# Patient Record
Sex: Female | Born: 1985 | Race: Black or African American | Hispanic: No | Marital: Single | Smoking: Never smoker
Health system: Southern US, Community
[De-identification: ages and names within clinical notes are randomized; demographics above are authoritative.]

## PROBLEM LIST (undated history)

## (undated) DIAGNOSIS — D571 Sickle-cell disease without crisis: Secondary | ICD-10-CM

---

## 2016-03-08 ENCOUNTER — Emergency Department (HOSPITAL_COMMUNITY)
Admission: EM | Admit: 2016-03-08 | Discharge: 2016-03-08 | Disposition: A | Discharge status: Home / Self Care | Payer: Medicaid Other | Attending: Emergency Medicine | Admitting: Emergency Medicine

## 2016-03-08 ENCOUNTER — Encounter (HOSPITAL_COMMUNITY): Payer: Self-pay | Admitting: Emergency Medicine

## 2016-03-08 ENCOUNTER — Emergency Department (HOSPITAL_COMMUNITY): Payer: Medicaid Other

## 2016-03-08 DIAGNOSIS — F17200 Nicotine dependence, unspecified, uncomplicated: Secondary | ICD-10-CM | POA: Diagnosis not present

## 2016-03-08 DIAGNOSIS — Z79899 Other long term (current) drug therapy: Secondary | ICD-10-CM | POA: Diagnosis not present

## 2016-03-08 DIAGNOSIS — R0602 Shortness of breath: Secondary | ICD-10-CM | POA: Diagnosis not present

## 2016-03-08 DIAGNOSIS — D5700 Hb-SS disease with crisis, unspecified: Secondary | ICD-10-CM | POA: Diagnosis present

## 2016-03-08 DIAGNOSIS — R05 Cough: Secondary | ICD-10-CM | POA: Diagnosis not present

## 2016-03-08 LAB — BASIC METABOLIC PANEL
ANION GAP: 9 (ref 5–15)
BUN: 5 mg/dL — ABNORMAL LOW (ref 6–20)
CO2: 23 mmol/L (ref 22–32)
Calcium: 9.5 mg/dL (ref 8.9–10.3)
Chloride: 105 mmol/L (ref 101–111)
Creatinine, Ser: 0.4 mg/dL — ABNORMAL LOW (ref 0.44–1.00)
Glucose, Bld: 100 mg/dL — ABNORMAL HIGH (ref 65–99)
POTASSIUM: 4.1 mmol/L (ref 3.5–5.1)
SODIUM: 137 mmol/L (ref 135–145)

## 2016-03-08 LAB — CBC WITH DIFFERENTIAL/PLATELET
BASOS ABS: 0 10*3/uL (ref 0.0–0.1)
Basophils Relative: 0 %
Eosinophils Absolute: 0 10*3/uL (ref 0.0–0.7)
Eosinophils Relative: 0 %
HEMATOCRIT: 21 % — AB (ref 36.0–46.0)
HEMOGLOBIN: 7.5 g/dL — AB (ref 12.0–15.0)
LYMPHS PCT: 24 %
Lymphs Abs: 4.7 10*3/uL — ABNORMAL HIGH (ref 0.7–4.0)
MCH: 31.4 pg (ref 26.0–34.0)
MCHC: 35.7 g/dL (ref 30.0–36.0)
MCV: 87.9 fL (ref 78.0–100.0)
MONOS PCT: 9 %
Monocytes Absolute: 1.7 10*3/uL — ABNORMAL HIGH (ref 0.1–1.0)
NEUTROS PCT: 67 %
Neutro Abs: 13 10*3/uL — ABNORMAL HIGH (ref 1.7–7.7)
Platelets: 440 10*3/uL — ABNORMAL HIGH (ref 150–400)
RBC: 2.39 MIL/uL — AB (ref 3.87–5.11)
RDW: 26.6 % — ABNORMAL HIGH (ref 11.5–15.5)
WBC: 19.4 10*3/uL — AB (ref 4.0–10.5)

## 2016-03-08 MED ORDER — DIPHENHYDRAMINE HCL 25 MG PO CAPS
50.0000 mg | ORAL_CAPSULE | Freq: Once | ORAL | Status: AC
  Administered 2016-03-08: 50 mg via ORAL
  Filled 2016-03-08: qty 2

## 2016-03-08 MED ORDER — HYDROMORPHONE HCL 1 MG/ML IJ SOLN
1.0000 mg | Freq: Once | INTRAMUSCULAR | Status: AC
  Administered 2016-03-08: 1 mg via INTRAVENOUS
  Filled 2016-03-08: qty 1

## 2016-03-08 MED ORDER — ONDANSETRON HCL 4 MG/2ML IJ SOLN
4.0000 mg | Freq: Once | INTRAMUSCULAR | Status: DC
  Filled 2016-03-08: qty 2

## 2016-03-08 MED ORDER — HYDROMORPHONE HCL 2 MG/ML IJ SOLN
2.0000 mg | Freq: Once | INTRAMUSCULAR | Status: AC
  Administered 2016-03-08: 2 mg via INTRAVENOUS
  Filled 2016-03-08: qty 1

## 2016-03-08 MED ORDER — ONDANSETRON HCL 4 MG/2ML IJ SOLN
4.0000 mg | Freq: Once | INTRAMUSCULAR | Status: AC
  Administered 2016-03-08: 4 mg via INTRAVENOUS
  Filled 2016-03-08: qty 2

## 2016-03-08 NOTE — ED Notes (Signed)
Patient made aware of the need for urine.  Patient states is unable to go at this time.

## 2016-03-08 NOTE — Discharge Instructions (Signed)
Sickle Cell Anemia, Adult Sickle cell anemia is a condition in which red blood cells have an abnormal "sickle" shape. This abnormal shape shortens the cells' life span, which results in a lower than normal concentration of red blood cells in the blood. The sickle shape also causes the cells to clump together and block free blood flow through the blood vessels. As a result, the tissues and organs of the body do not receive enough oxygen. Sickle cell anemia causes organ damage and pain and increases the risk of infection. CAUSES  Sickle cell anemia is a genetic disorder. Those who receive two copies of the gene have the condition, and those who receive one copy have the trait. RISK FACTORS The sickle cell gene is most common in people whose families originated in [place]. Other areas of the globe where sickle cell trait occurs include the Mediterranean, [place] and [place], the [place], and the [place].  SIGNS AND SYMPTOMS  Pain, especially in the extremities, back, chest, or abdomen (common). The pain may start suddenly or may develop following an illness, especially if there is dehydration. Pain can also occur due to overexertion or exposure to extreme temperature changes.  Frequent severe bacterial infections, especially certain types of pneumonia and meningitis.  Pain and swelling in the hands and feet.  Decreased activity.   Loss of appetite.   Change in behavior.  Headaches.  Seizures.  Shortness of breath or difficulty breathing.  Vision changes.  Skin ulcers. Those with the trait may not have symptoms or they may have mild symptoms.  DIAGNOSIS  Sickle cell anemia is diagnosed with blood tests that demonstrate the genetic trait. It is often diagnosed during the newborn period, due to mandatory testing nationwide. A variety of blood tests, X-rays, CT scans, MRI scans, ultrasounds, and lung function tests may also be done to monitor the condition. TREATMENT  Sickle  cell anemia may be treated with:  Medicines. You may be given pain medicines, antibiotic medicines (to treat and prevent infections) or medicines to increase the production of certain types of hemoglobin.  Fluids.  Oxygen.  Blood transfusions. HOME CARE INSTRUCTIONS   Drink enough fluid to keep your urine clear or pale yellow. Increase your fluid intake in hot weather and during exercise.  Do not smoke. Smoking lowers oxygen levels in the blood.   Only take over-the-counter or prescription medicines for pain, fever, or discomfort as directed by your health care provider.  Take antibiotics as directed by your health care provider. Make sure you finish them it even if you start to feel better.   Take supplements as directed by your health care provider.   Consider wearing a medical alert bracelet. This tells anyone caring for you in an emergency of your condition.   When traveling, keep your medical information, health care provider's names, and the medicines you take with you at all times.   If you develop a fever, do not take medicines to reduce the fever right away. This could cover up a problem that is developing. Notify your health care provider.  Keep all follow-up appointments with your health care provider. Sickle cell anemia requires regular medical care. SEEK MEDICAL CARE IF: You have a fever. SEEK IMMEDIATE MEDICAL CARE IF:   You feel dizzy or faint.   You have new abdominal pain, especially on the left side near the stomach area.   You develop a persistent, often uncomfortable and painful penile erection (priapism). If this is not treated immediately it   will lead to impotence.   You have numbness your arms or legs or you have a hard time moving them.   You have a hard time with speech.   You have a fever or persistent symptoms for more than 2-3 days.   You have a fever and your symptoms suddenly get worse.   You have signs or symptoms of infection.  These include:   Chills.   Abnormal tiredness (lethargy).   Irritability.   Poor eating.   Vomiting.   You develop pain that is not helped with medicine.   You develop shortness of breath.  You have pain in your chest.   You are coughing up pus-like or bloody sputum.   You develop a stiff neck.  Your feet or hands swell or have pain.  Your abdomen appears bloated.  You develop joint pain. MAKE SURE YOU:  Understand these instructions.   This information is not intended to replace advice given to you by your health care provider. Make sure you discuss any questions you have with your health care provider.   Document Released: 02/04/2006 Document Revised: 11/17/2014 Document Reviewed: 06/08/2013 Elsevier Interactive Patient Education 2016 [employer] Inc[employer]  

## 2016-03-08 NOTE — ED Provider Notes (Signed)
Received care of patient from Dr. Christy Gentles at 7 AM. Please see his note for prior history, physical and care. Briefly this is a 30 year old female with a history of sickle cell who presents with bilateral lower extremity pain consistent with prior sickle cell pain crises. Patient reports her baseline hemoglobin is 7, he has been here 7.5. Reports she has persistent leukocytosis. Denies any infectious symptoms. BMP within normal limits. Patient received 3 doses of IV Dilaudid with improvement of her pain. Pt ambulatory, tolerating po. Patient discharged in stable condition with understanding of reasons to return.        Basic metabolic panel (Final result)Abnormal Component (Lab Inquiry)    Collection Time Result Time NA K CL CO2 GLUCOSE   03/08/16 06:49:00 03/08/16 07:31:04 137 4.1 105 23 100 (H)      Collection Time Result Time BUN Creatinine, Ser CALCIUM GFR calc non Af Amer GFR calc Af Amer   03/08/16 06:49:00 03/08/16 07:31:04 5 (L) 0.40 (L) 9.5 >60  >60    (Comment)    (NOTE)  The eGFR has been calculated using the CKD EPI equation.  This calculation has not been validated in all clinical situations.  eGFR's persistently <60 mL/min signify possible Chronic Kidney  Disease.            Collection Time Result Time Anion gap   03/08/16 06:49:00 03/08/16 07:31:04 9             CBC with Differential/Platelet (Final result)Abnormal Component (Lab Inquiry)    Collection Time Result Time WBC RBC HGB HCT MCV   03/08/16 06:49:00 03/08/16 07:32:03  19.4 (H)    (Comment)    REPEATED TO VERIFY  WHITE COUNT CONFIRMED ON SMEAR  RARE NRBCs       2.39 (L) 7.5 (L) 21.0 (L) 87.9      Collection Time Result Time MCH MCHC RDW PLT NEUTRO PCT   03/08/16 06:49:00 03/08/16 07:32:03 31.4 35.7 26.6 (H) 440 (H) 67      Collection Time Result Time LYMPHO PCT MONO PCT EOS PCT BASOS PCT AB NEUTRO   03/08/16 06:49:00 03/08/16 07:32:03 24 9 0 0 13.0 (H)       Collection Time Result Time AB LYM MONO ABS EOSINO ABS BASOS ABS RBC Morphology   03/08/16 06:49:00 03/08/16 07:32:03 4.7 (H) 1.7 (H) 0.0 0.0 POLYCHROMASIA PRESENT TARGET CELLS TEARDROP CELLS SICKLE CELLS            Imaging Results       DG Chest 2 View (Final result) Result time: 03/08/16 06:27:51   Final result by Rad Results In Interface (03/08/16 06:27:51)   Narrative:   CLINICAL DATA: Sickle cell crisis  EXAM: CHEST 2 VIEW  COMPARISON: None.  FINDINGS: Normal heart size and mediastinal contours. No acute infiltrate or edema. No effusion or pneumothorax. Cholecystectomy clips. No acute osseous finding.  IMPRESSION: Negative chest.      Gareth Morgan, MD 03/09/16 [phone]

## 2016-03-08 NOTE — ED Provider Notes (Signed)
CSN: [id number]     Arrival date & time 03/08/16  0309 History   First MD Initiated Contact with Patient 03/08/16 [phone]     Chief Complaint  Patient presents with  . Sickle Cell Pain Crisis      Patient is a 30 y.o. female presenting with sickle cell pain. The history is provided by the patient.  Sickle Cell Pain Crisis Location:  Lower extremity Severity:  Moderate Onset quality:  Gradual Duration:  1 day Timing:  Constant Progression:  Worsening Chronicity:  Recurrent Relieved by:  Nothing Worsened by:  Nothing tried Associated symptoms: cough and shortness of breath   Associated symptoms: no chest pain, no fever and no vomiting   Patient with h/o sickle cell disease presents with pain in bilateral lower extremities for past day It is worsening This is similar to prior episodes of pain crisis She also reports mild lower abdominal pain She also reportscough and SOB No CP No fever She lives in [place] and here visiting in [place]    PMH - sickle cell disease  Social History  Substance Use Topics  . Smoking status: Current Every Day Smoker  . Smokeless tobacco: None  . Alcohol Use: No   OB History    No data available     Review of Systems  Constitutional: Negative for fever.  Respiratory: Positive for cough and shortness of breath.   Cardiovascular: Negative for chest pain.  Gastrointestinal: Negative for vomiting.  All other systems reviewed and are negative.     Allergies  Review of patient's allergies indicates no known allergies.  Home Medications   Prior to Admission medications   Medication Sig Start Date End Date Taking? Authorizing Provider  folic acid (FOLVITE) 1 MG tablet Take 1 mg by mouth daily.   Yes Historical Provider, MD  hydroxyurea (HYDREA) 500 MG capsule Take 1,500 mg by mouth daily. May take with food to minimize GI side effects.   Yes Historical Provider, MD  Oxycodone HCl 10 MG TABS Take 10 mg by mouth every 4 (four) hours as  needed. For pain. 02/05/16  Yes Historical Provider, MD  OXYCONTIN 10 MG 12 hr tablet Take 10 mg by mouth 2 (two) times daily. 02/11/16  Yes Historical Provider, MD   BP 103/53 mmHg  Pulse 86  Temp(Src) 98.6 F (37 C) (Oral)  Resp 18  SpO2 92%  LMP 02/09/2016 Physical Exam CONSTITUTIONAL: Well developed/well nourished HEAD: Normocephalic/atraumatic EYES: EOMI/PERRL ENMT: Mucous membranes moist NECK: supple no meningeal signs SPINE/BACK:entire spine nontender CV: S1/S2 noted, no murmurs/rubs/gallops noted LUNGS: Lungs are clear to auscultation bilaterally, no apparent distress ABDOMEN: soft, mild LLQ tenderness, no rebound or guarding NEURO: Pt is awake/alert/appropriate, moves all extremitiesx4.  No facial droop.   EXTREMITIES: pulses normal/equal, full ROM, no edema to lower extremities, no deformities, no joint effusions noted SKIN: warm, color normal PSYCH: no abnormalities of mood noted, alert and oriented to situation  ED Course  Procedures  Medications  HYDROmorphone (DILAUDID) injection 1 mg (not administered)  ondansetron (ZOFRAN) injection 4 mg (not administered)  diphenhydrAMINE (BENADRYL) capsule 50 mg (not administered)  HYDROmorphone (DILAUDID) injection 1 mg (1 mg Intravenous Given 03/08/16 0633)  ondansetron (ZOFRAN) injection 4 mg (4 mg Intravenous Given 03/08/16 0630)    Labs Review Labs Reviewed  BASIC METABOLIC PANEL  CBC WITH DIFFERENTIAL/PLATELET  URINALYSIS, ROUTINE W REFLEX MICROSCOPIC (NOT AT [facility] [place])  POC URINE PREG, ED    Imaging Review Dg Chest 2 View  03/08/2016  CLINICAL DATA:  Sickle cell crisis EXAM: CHEST  2 VIEW COMPARISON:  None. FINDINGS: Normal heart size and mediastinal contours. No acute infiltrate or edema. No effusion or pneumothorax. Cholecystectomy clips. No acute osseous finding. IMPRESSION: Negative chest. Electronically Signed   By: Monte Fantasia M.D.   On: 03/08/2016 06:27   I have personally reviewed and evaluated these images  and lab results as part of my medical decision-making.   7:23 AM Pt stable Labs pending She reports being out of pain meds as she lives in [place] of [place] Will treat pain and reassess D/w dr Billy Fischer to reassess after pain meds BP 103/53 mmHg  Pulse 86  Temp(Src) 98.6 F (37 C) (Oral)  Resp 18  SpO2 92%  LMP 02/09/2016  MDM   Final diagnoses:  Sickle cell pain crisis [facility])    Nursing notes including past medical history and social history reviewed and considered in documentation xrays/imaging reviewed by myself and considered during evaluation     Ripley Fraise, MD 03/08/16 [phone]

## 2016-03-08 NOTE — ED Notes (Signed)
Pt. Made aware for the need of urine. 

## 2016-03-08 NOTE — ED Notes (Signed)
This RN attempted IVx2, asked Liliane Channel, RN to try.

## 2016-03-08 NOTE — ED Notes (Signed)
Pt c/o of pain from the waist down all day today. Pt out of pain meds x 3 days. Pt placed on 2L 02, no longer feels [facility]

## 2016-03-15 ENCOUNTER — Encounter (HOSPITAL_COMMUNITY): Payer: Self-pay | Admitting: *Deleted

## 2016-03-15 ENCOUNTER — Emergency Department (HOSPITAL_COMMUNITY): Payer: Medicaid Other

## 2016-03-15 ENCOUNTER — Inpatient Hospital Stay (HOSPITAL_COMMUNITY)
Admission: EM | Admit: 2016-03-15 | Discharge: 2016-03-19 | DRG: 812 | Disposition: A | Discharge status: Home / Self Care | Payer: Medicaid Other | Attending: Internal Medicine | Admitting: Internal Medicine

## 2016-03-15 DIAGNOSIS — F17200 Nicotine dependence, unspecified, uncomplicated: Secondary | ICD-10-CM | POA: Diagnosis present

## 2016-03-15 DIAGNOSIS — R0789 Other chest pain: Secondary | ICD-10-CM | POA: Diagnosis not present

## 2016-03-15 DIAGNOSIS — D5700 Hb-SS disease with crisis, unspecified: Secondary | ICD-10-CM | POA: Diagnosis present

## 2016-03-15 DIAGNOSIS — R0902 Hypoxemia: Secondary | ICD-10-CM | POA: Diagnosis not present

## 2016-03-15 DIAGNOSIS — J9811 Atelectasis: Secondary | ICD-10-CM | POA: Diagnosis not present

## 2016-03-15 DIAGNOSIS — Z9049 Acquired absence of other specified parts of digestive tract: Secondary | ICD-10-CM | POA: Diagnosis not present

## 2016-03-15 DIAGNOSIS — Z8673 Personal history of transient ischemic attack (TIA), and cerebral infarction without residual deficits: Secondary | ICD-10-CM | POA: Diagnosis not present

## 2016-03-15 DIAGNOSIS — R079 Chest pain, unspecified: Secondary | ICD-10-CM | POA: Diagnosis not present

## 2016-03-15 DIAGNOSIS — D638 Anemia in other chronic diseases classified elsewhere: Secondary | ICD-10-CM | POA: Insufficient documentation

## 2016-03-15 DIAGNOSIS — D7589 Other specified diseases of blood and blood-forming organs: Secondary | ICD-10-CM | POA: Diagnosis present

## 2016-03-15 DIAGNOSIS — D72829 Elevated white blood cell count, unspecified: Secondary | ICD-10-CM | POA: Diagnosis present

## 2016-03-15 DIAGNOSIS — R17 Unspecified jaundice: Secondary | ICD-10-CM | POA: Diagnosis present

## 2016-03-15 DIAGNOSIS — Z79899 Other long term (current) drug therapy: Secondary | ICD-10-CM

## 2016-03-15 DIAGNOSIS — D473 Essential (hemorrhagic) thrombocythemia: Secondary | ICD-10-CM | POA: Diagnosis present

## 2016-03-15 DIAGNOSIS — D75839 Thrombocytosis, unspecified: Secondary | ICD-10-CM | POA: Diagnosis present

## 2016-03-15 DIAGNOSIS — D599 Acquired hemolytic anemia, unspecified: Secondary | ICD-10-CM | POA: Diagnosis not present

## 2016-03-15 DIAGNOSIS — J9601 Acute respiratory failure with hypoxia: Secondary | ICD-10-CM | POA: Diagnosis not present

## 2016-03-15 HISTORY — DX: Sickle-cell disease without crisis: D57.1

## 2016-03-15 LAB — BASIC METABOLIC PANEL
ANION GAP: 10 (ref 5–15)
BUN: 7 mg/dL (ref 6–20)
CHLORIDE: 104 mmol/L (ref 101–111)
CO2: 26 mmol/L (ref 22–32)
Calcium: 9.9 mg/dL (ref 8.9–10.3)
Creatinine, Ser: 0.33 mg/dL — ABNORMAL LOW (ref 0.44–1.00)
GFR calc non Af Amer: >60 mL/min (ref >60)
Glucose, Bld: 122 mg/dL — ABNORMAL HIGH (ref 65–99)
POTASSIUM: 3.6 mmol/L (ref 3.5–5.1)
Sodium: 140 mmol/L (ref 135–145)

## 2016-03-15 LAB — COMPREHENSIVE METABOLIC PANEL
ALBUMIN: 4.6 g/dL (ref 3.5–5.0)
ALT: 18 U/L (ref 14–54)
ANION GAP: 10 (ref 5–15)
AST: 42 U/L — ABNORMAL HIGH (ref 15–41)
Alkaline Phosphatase: 49 U/L (ref 38–126)
BILIRUBIN TOTAL: 5.4 mg/dL — AB (ref 0.3–1.2)
BUN: 7 mg/dL (ref 6–20)
CO2: 24 mmol/L (ref 22–32)
Calcium: 9.3 mg/dL (ref 8.9–10.3)
Chloride: 104 mmol/L (ref 101–111)
Creatinine, Ser: 0.38 mg/dL — ABNORMAL LOW (ref 0.44–1.00)
GFR calc Af Amer: >60 mL/min (ref >60)
Glucose, Bld: 116 mg/dL — ABNORMAL HIGH (ref 65–99)
POTASSIUM: 4.3 mmol/L (ref 3.5–5.1)
Sodium: 138 mmol/L (ref 135–145)
TOTAL PROTEIN: 7.7 g/dL (ref 6.5–8.1)

## 2016-03-15 LAB — CBC WITH DIFFERENTIAL/PLATELET
Basophils Absolute: 0 10*3/uL (ref 0.0–0.1)
Basophils Relative: 0 %
Eosinophils Absolute: 0 10*3/uL (ref 0.0–0.7)
Eosinophils Relative: 0 %
HCT: 18.8 % — ABNORMAL LOW (ref 36.0–46.0)
Hemoglobin: 6.6 g/dL — CL (ref 12.0–15.0)
Lymphocytes Relative: 11 %
Lymphs Abs: 3.4 10*3/uL (ref 0.7–4.0)
MCH: 30.8 pg (ref 26.0–34.0)
MCHC: 35.1 g/dL (ref 30.0–36.0)
MCV: 87.9 fL (ref 78.0–100.0)
Monocytes Absolute: 2.2 10*3/uL — ABNORMAL HIGH (ref 0.1–1.0)
Monocytes Relative: 7 %
Neutro Abs: 25.3 10*3/uL — ABNORMAL HIGH (ref 1.7–7.7)
Neutrophils Relative %: 82 %
Platelets: 424 10*3/uL — ABNORMAL HIGH (ref 150–400)
RBC: 2.14 MIL/uL — ABNORMAL LOW (ref 3.87–5.11)
RDW: 24.2 % — ABNORMAL HIGH (ref 11.5–15.5)
WBC: 30.9 10*3/uL — ABNORMAL HIGH (ref 4.0–10.5)

## 2016-03-15 LAB — URINALYSIS, ROUTINE W REFLEX MICROSCOPIC
Glucose, UA: NEGATIVE mg/dL
Hgb urine dipstick: NEGATIVE
KETONES UR: NEGATIVE mg/dL
LEUKOCYTES UA: NEGATIVE
Nitrite: POSITIVE — AB
PH: 6 (ref 5.0–8.0)
PROTEIN: NEGATIVE mg/dL
Specific Gravity, Urine: 1.014 (ref 1.005–1.030)

## 2016-03-15 LAB — RETICULOCYTES
RBC.: 2.35 MIL/uL — ABNORMAL LOW (ref 3.87–5.11)
RBC.: 2.17 MIL/uL — ABNORMAL LOW (ref 3.87–5.11)
Retic Ct Pct: >23 % — ABNORMAL HIGH (ref 0.4–3.1)

## 2016-03-15 LAB — LACTATE DEHYDROGENASE: LDH: 405 U/L — AB (ref 98–192)

## 2016-03-15 LAB — I-STAT BETA HCG BLOOD, ED (MC, WL, AP ONLY): I-stat hCG, quantitative: <5 m[IU]/mL (ref <5)

## 2016-03-15 LAB — CBC
HEMATOCRIT: 20.8 % — AB (ref 36.0–46.0)
HEMOGLOBIN: 7.2 g/dL — AB (ref 12.0–15.0)
MCH: 30.6 pg (ref 26.0–34.0)
MCHC: 34.6 g/dL (ref 30.0–36.0)
MCV: 88.5 fL (ref 78.0–100.0)
Platelets: 436 10*3/uL — ABNORMAL HIGH (ref 150–400)
RBC: 2.35 MIL/uL — ABNORMAL LOW (ref 3.87–5.11)
RDW: 25.7 % — ABNORMAL HIGH (ref 11.5–15.5)
WBC: 28.7 10*3/uL — AB (ref 4.0–10.5)

## 2016-03-15 LAB — URINE MICROSCOPIC-ADD ON
RBC / HPF: NONE SEEN RBC/hpf (ref 0–5)
WBC UA: NONE SEEN WBC/hpf (ref 0–5)

## 2016-03-15 LAB — MAGNESIUM: Magnesium: 1.8 mg/dL (ref 1.7–2.4)

## 2016-03-15 LAB — I-STAT TROPONIN, ED: Troponin i, poc: 0 ng/mL (ref 0.00–0.08)

## 2016-03-15 LAB — PROTIME-INR
INR: 1.27 (ref 0.00–1.49)
PROTHROMBIN TIME: 15.5 s — AB (ref 11.6–15.2)

## 2016-03-15 MED ORDER — DIPHENHYDRAMINE HCL 50 MG/ML IJ SOLN: 12.5000 mg | Freq: Four times a day (QID) | INTRAMUSCULAR | Status: DC | PRN

## 2016-03-15 MED ORDER — SODIUM CHLORIDE 0.9% FLUSH: 9.0000 mL | INTRAVENOUS | Status: DC | PRN

## 2016-03-15 MED ORDER — SODIUM CHLORIDE 0.9% FLUSH
9.0000 mL | INTRAVENOUS | Status: DC | PRN
Start: 2016-03-15 — End: 2016-03-19

## 2016-03-15 MED ORDER — HYDROMORPHONE HCL 2 MG/ML IJ SOLN
2.0000 mg | INTRAMUSCULAR | Status: DC
  Administered 2016-03-15 – 2016-03-17 (×21): 2 mg via INTRAVENOUS
  Filled 2016-03-15 (×14): qty 1

## 2016-03-15 MED ORDER — ONDANSETRON HCL 4 MG/2ML IJ SOLN
4.0000 mg | Freq: Four times a day (QID) | INTRAMUSCULAR | Status: DC | PRN
Start: 2016-03-15 — End: 2016-03-15

## 2016-03-15 MED ORDER — IOPAMIDOL (ISOVUE-370) INJECTION 76%
100.0000 mL | Freq: Once | INTRAVENOUS | Status: DC | PRN
Start: 2016-03-15 — End: 2016-03-19

## 2016-03-15 MED ORDER — HYDROMORPHONE 1 MG/ML IV SOLN
INTRAVENOUS | Status: DC
  Administered 2016-03-15: 07:00:00 via INTRAVENOUS
  Filled 2016-03-15: qty 25

## 2016-03-15 MED ORDER — ONDANSETRON HCL 4 MG/2ML IJ SOLN
4.0000 mg | INTRAMUSCULAR | Status: DC | PRN
  Administered 2016-03-15: 4 mg via INTRAVENOUS
  Filled 2016-03-15: qty 2

## 2016-03-15 MED ORDER — SODIUM CHLORIDE 0.9 % IV SOLN
25.0000 mg | INTRAVENOUS | Status: DC | PRN
  Filled 2016-03-15: qty 0.5

## 2016-03-15 MED ORDER — HYDROMORPHONE HCL 2 MG/ML IJ SOLN: 0.0375 mg/kg | INTRAMUSCULAR | Status: AC

## 2016-03-15 MED ORDER — HYDROMORPHONE HCL 2 MG/ML IJ SOLN
0.0250 mg/kg | INTRAMUSCULAR | Status: AC
  Administered 2016-03-15: 1.4 mg via INTRAVENOUS
  Filled 2016-03-15: qty 1

## 2016-03-15 MED ORDER — HYDROMORPHONE HCL 2 MG/ML IJ SOLN
0.0313 mg/kg | INTRAMUSCULAR | Status: AC
  Administered 2016-03-15: 1.7 mg via INTRAVENOUS
  Filled 2016-03-15: qty 1

## 2016-03-15 MED ORDER — DIPHENHYDRAMINE HCL 25 MG PO CAPS
25.0000 mg | ORAL_CAPSULE | ORAL | Status: DC | PRN
  Administered 2016-03-16 – 2016-03-18 (×3): 25 mg via ORAL
  Filled 2016-03-15 (×3): qty 1

## 2016-03-15 MED ORDER — POLYETHYLENE GLYCOL 3350 17 G PO PACK: 17.0000 g | PACK | Freq: Every day | ORAL | Status: DC | PRN

## 2016-03-15 MED ORDER — DEXTROSE-NACL 5-0.45 % IV SOLN
INTRAVENOUS | Status: AC
  Administered 2016-03-15: 07:00:00 via INTRAVENOUS

## 2016-03-15 MED ORDER — ENSURE ENLIVE PO LIQD
237.0000 mL | Freq: Two times a day (BID) | ORAL | Status: DC
  Administered 2016-03-15 – 2016-03-19 (×6): 237 mL via ORAL

## 2016-03-15 MED ORDER — SODIUM CHLORIDE 0.45 % IV SOLN
INTRAVENOUS | Status: DC
  Administered 2016-03-15 – 2016-03-18 (×11): via INTRAVENOUS
  Administered 2016-03-19: 1000 mL via INTRAVENOUS

## 2016-03-15 MED ORDER — NALOXONE HCL 0.4 MG/ML IJ SOLN: 0.4000 mg | INTRAMUSCULAR | Status: DC | PRN

## 2016-03-15 MED ORDER — HYDROMORPHONE HCL 2 MG/ML IJ SOLN
2.0000 mg | INTRAMUSCULAR | Status: DC | PRN
Start: 2016-03-15 — End: 2016-03-15

## 2016-03-15 MED ORDER — ENOXAPARIN SODIUM 40 MG/0.4ML ~~LOC~~ SOLN
40.0000 mg | SUBCUTANEOUS | Status: DC
  Filled 2016-03-15 (×3): qty 0.4

## 2016-03-15 MED ORDER — KETOROLAC TROMETHAMINE 15 MG/ML IJ SOLN
30.0000 mg | Freq: Four times a day (QID) | INTRAMUSCULAR | Status: DC
  Administered 2016-03-15 – 2016-03-19 (×16): 30 mg via INTRAVENOUS
  Filled 2016-03-15 (×15): qty 2

## 2016-03-15 MED ORDER — FOLIC ACID 1 MG PO TABS
1.0000 mg | ORAL_TABLET | Freq: Every day | ORAL | Status: DC
  Administered 2016-03-15 – 2016-03-19 (×5): 1 mg via ORAL
  Filled 2016-03-15 (×5): qty 1

## 2016-03-15 MED ORDER — KETOROLAC TROMETHAMINE 15 MG/ML IJ SOLN
15.0000 mg | Freq: Four times a day (QID) | INTRAMUSCULAR | Status: DC
  Administered 2016-03-15 (×2): 15 mg via INTRAVENOUS
  Filled 2016-03-15 (×2): qty 1

## 2016-03-15 MED ORDER — HYDROMORPHONE HCL 2 MG/ML IJ SOLN: 0.0313 mg/kg | INTRAMUSCULAR | Status: AC

## 2016-03-15 MED ORDER — HYDROMORPHONE HCL 2 MG/ML IJ SOLN
0.0375 mg/kg | INTRAMUSCULAR | Status: AC
  Administered 2016-03-15: 2.1 mg via INTRAVENOUS
  Filled 2016-03-15: qty 2

## 2016-03-15 MED ORDER — HYDROMORPHONE 1 MG/ML IV SOLN
INTRAVENOUS | Status: DC
  Administered 2016-03-15: 13.2 mg via INTRAVENOUS
  Administered 2016-03-15: 23:00:00 via INTRAVENOUS
  Administered 2016-03-15: 5 mg via INTRAVENOUS
  Administered 2016-03-16: 2.5 mg via INTRAVENOUS
  Administered 2016-03-16: 6.5 mg via INTRAVENOUS
  Administered 2016-03-16: 5.5 mg via INTRAVENOUS
  Administered 2016-03-16: 1.5 mg via INTRAVENOUS
  Administered 2016-03-16: 3.5 mg via INTRAVENOUS
  Administered 2016-03-16: 3.96 mg via INTRAVENOUS
  Administered 2016-03-17: 4.5 mg via INTRAVENOUS
  Administered 2016-03-17: 1.25 mg via INTRAVENOUS
  Administered 2016-03-17 (×2): 2.5 mg via INTRAVENOUS
  Administered 2016-03-17: 0 mg via INTRAVENOUS
  Administered 2016-03-18 (×2): 4 mg via INTRAVENOUS
  Administered 2016-03-18: 4.5 mg via INTRAVENOUS
  Administered 2016-03-18: 5 mg via INTRAVENOUS
  Administered 2016-03-18: 11:00:00 via INTRAVENOUS
  Administered 2016-03-18: 0.5 mg via INTRAVENOUS
  Administered 2016-03-19: 25 mg via INTRAVENOUS
  Administered 2016-03-19: 8 mg via INTRAVENOUS
  Administered 2016-03-19: 4.5 mg via INTRAVENOUS
  Administered 2016-03-19: 8 mg via INTRAVENOUS
  Administered 2016-03-19: 3 mg via INTRAVENOUS
  Filled 2016-03-15 (×4): qty 25

## 2016-03-15 MED ORDER — HYDROMORPHONE HCL 2 MG/ML IJ SOLN
2.0000 mg | INTRAMUSCULAR | Status: DC
  Administered 2016-03-15 (×2): 2 mg via INTRAVENOUS

## 2016-03-15 MED ORDER — HYDROMORPHONE HCL 2 MG/ML IJ SOLN
2.0000 mg | Freq: Once | INTRAMUSCULAR | Status: AC
  Administered 2016-03-15: 2 mg via INTRAVENOUS

## 2016-03-15 MED ORDER — DIPHENHYDRAMINE HCL 25 MG PO CAPS: 25.0000 mg | ORAL_CAPSULE | ORAL | Status: DC | PRN

## 2016-03-15 MED ORDER — HYDROMORPHONE HCL 2 MG/ML IJ SOLN: 0.0250 mg/kg | INTRAMUSCULAR | Status: AC

## 2016-03-15 MED ORDER — KETOROLAC TROMETHAMINE 30 MG/ML IJ SOLN
30.0000 mg | Freq: Once | INTRAMUSCULAR | Status: AC
  Administered 2016-03-15: 30 mg via INTRAVENOUS
  Filled 2016-03-15: qty 1

## 2016-03-15 MED ORDER — DIPHENHYDRAMINE HCL 12.5 MG/5ML PO ELIX: 12.5000 mg | ORAL_SOLUTION | Freq: Four times a day (QID) | ORAL | Status: DC | PRN

## 2016-03-15 MED ORDER — HYDROXYUREA 500 MG PO CAPS
1500.0000 mg | ORAL_CAPSULE | Freq: Every day | ORAL | Status: DC
  Administered 2016-03-16: 1500 mg via ORAL
  Filled 2016-03-15: qty 3

## 2016-03-15 MED ORDER — SENNOSIDES-DOCUSATE SODIUM 8.6-50 MG PO TABS
1.0000 | ORAL_TABLET | Freq: Two times a day (BID) | ORAL | Status: DC
  Administered 2016-03-15 – 2016-03-19 (×8): 1 via ORAL
  Filled 2016-03-15 (×9): qty 1

## 2016-03-15 NOTE — Progress Notes (Signed)
Patient c/o PIV site leaking. RN flushed site and put a new dressing on IV site. Patient was all right with the site for a while and now is c/o PIV again. IV team was paged.

## 2016-03-15 NOTE — Progress Notes (Signed)
Received call from CCMD that patient had removed heart monitor.  Patient up in bathroom with significant other.  Informed patient that per order telemetry monitor has to stay on while there is an order; replaced heart monitor and also educated patient that she is high fall risk d/t recent falls and must call for staff when she is up in the room.  Patient agreeable to this. Patient also requesting more pain medication.  Will continue to monitor patient.

## 2016-03-15 NOTE — ED Notes (Signed)
Entered patient room to find patient had disconnected IV fluids, removed BP cuff, and taken off SPO2 monitor. Patient was chagned into a dry gown and reconnected to IV fluids, placed back on BP and SPO2 monitoring, and telemetry monitor.

## 2016-03-15 NOTE — Progress Notes (Addendum)
Patient refusing Lovenox; states 'I know it is to prevent blood clots.  I don't take the lovenox." Dr. Zigmund Daniel aware; SCDs ordered and placed on patient.

## 2016-03-15 NOTE — ED Notes (Signed)
Patient requesting additional pain meds

## 2016-03-15 NOTE — ED Provider Notes (Signed)
CSN: [id number]     Arrival date & time 03/15/16  0010 History  By signing my name below, I, Emmanuella Mensah, attest that this documentation has been prepared under the direction and in the presence of Dorie Rank, MD. Electronically Signed: Judithann Sauger, ED Scribe. 03/15/2016. 1:59 AM.    Chief Complaint  Patient presents with  . Chest Pain   The history is provided by the patient. No language interpreter was used.   HPI Comments: Brittney Rocha is a 30 y.o. female with a hx of sickle cell anemia who presents to the Emergency Department complaining of gradually worsening chest pain and upper extremities pain onset 4 hours ago. She reports associated nausea and SOB. She explains that her sickle cell crisis pain is normally in her bilateral legs but she reports a hx of chest syndrome. No alleviating factors noted. Pt has not tried any medications PTA. She reports NKDA. No fever, chills, or vomiting.   Sickle Cell physician is in [place], [place].  Past Medical History  Diagnosis Date  . Sickle cell anemia (HCC)    History reviewed. No pertinent past surgical history. No family history on file. Social History  Substance Use Topics  . Smoking status: Current Every Day Smoker  . Smokeless tobacco: None  . Alcohol Use: No   OB History    No data available     Review of Systems  Constitutional: Negative for fever and chills.  Respiratory: Positive for shortness of breath.   Cardiovascular: Positive for chest pain.  Gastrointestinal: Positive for nausea. Negative for vomiting.      Allergies  Review of patient's allergies indicates no known allergies.  Home Medications   Prior to Admission medications   Medication Sig Start Date End Date Taking? Authorizing Provider  folic acid (FOLVITE) 1 MG tablet Take 1 mg by mouth daily.   Yes Historical Provider, MD  hydroxyurea (HYDREA) 500 MG capsule Take 1,500 mg by mouth daily. May take with food to minimize GI side effects.   Yes  Historical Provider, MD  Oxycodone HCl 10 MG TABS Take 10 mg by mouth every 4 (four) hours as needed. For pain. 02/05/16  Yes Historical Provider, MD  OXYCONTIN 10 MG 12 hr tablet Take 10 mg by mouth 2 (two) times daily. 02/11/16  Yes Historical Provider, MD   BP 110/72 mmHg  Pulse 81  Resp 20  Wt 55.792 kg  SpO2 98%  LMP 03/10/2016 Physical Exam  Constitutional: She appears well-developed and well-nourished.  Appears to be in pain  HENT:  Head: Normocephalic and atraumatic.  Right Ear: External ear normal.  Left Ear: External ear normal.  Eyes: Conjunctivae are normal. Right eye exhibits no discharge. Left eye exhibits no discharge. No scleral icterus.  Neck: Neck supple. No tracheal deviation present.  Cardiovascular: Normal rate, regular rhythm and intact distal pulses.   Pulmonary/Chest: Effort normal and breath sounds normal. No stridor. No respiratory distress. She has no wheezes. She has no rales. She exhibits tenderness.  Abdominal: Soft. Bowel sounds are normal. She exhibits no distension. There is no tenderness. There is no rebound and no guarding.  Musculoskeletal: She exhibits no edema or tenderness.  Neurological: She is alert. She has normal strength. No cranial nerve deficit (no facial droop, extraocular movements intact, no slurred speech) or sensory deficit. She exhibits normal muscle tone. She displays no seizure activity. Coordination normal.  Skin: Skin is warm and dry. No rash noted.  Psychiatric: She has a normal mood and affect.  Nursing note and vitals reviewed.   ED Course  Procedures (including critical care time) DIAGNOSTIC STUDIES: Oxygen Saturation is 94% on RA, normal by my interpretation.    COORDINATION OF CARE: 1:58 AM- Pt advised of plan for treatment and pt agrees. Pt will receive chest x-ray and lab work for further evaluation.    Medications  0.45 % sodium chloride infusion ( Intravenous New Bag/Given 03/15/16 0221)  diphenhydrAMINE (BENADRYL)  capsule 25-50 mg (not administered)  ondansetron (ZOFRAN) injection 4 mg (not administered)  iopamidol (ISOVUE-370) 76 % injection 100 mL (not administered)  HYDROmorphone (DILAUDID) injection 2.1 mg (not administered)    Or  HYDROmorphone (DILAUDID) injection 2.1 mg (not administered)  HYDROmorphone (DILAUDID) injection 1.4 mg (1.4 mg Intravenous Given 03/15/16 0221)    Or  HYDROmorphone (DILAUDID) injection 1.4 mg ( Subcutaneous See Alternative 03/15/16 0221)  HYDROmorphone (DILAUDID) injection 1.7 mg (1.7 mg Intravenous Given 03/15/16 0344)    Or  HYDROmorphone (DILAUDID) injection 1.7 mg ( Subcutaneous See Alternative 03/15/16 0344)    CRITICAL CARE Performed by: [id number] Total critical care time: 35 minutes Critical care time was exclusive of separately billable procedures and treating other patients. Critical care was necessary to treat or prevent imminent or life-threatening deterioration. Critical care was time spent personally by me on the following activities: development of treatment plan with patient and/or surrogate as well as nursing, discussions with consultants, evaluation of patient's response to treatment, examination of patient, obtaining history from patient or surrogate, ordering and performing treatments and interventions, ordering and review of laboratory studies, ordering and review of radiographic studies, pulse oximetry and re-evaluation of patient's condition.  Labs Review Labs Reviewed  BASIC METABOLIC PANEL - Abnormal; Notable for the following:    Glucose, Bld 122 (*)    Creatinine, Ser 0.33 (*)    All other components within normal limits  CBC - Abnormal; Notable for the following:    WBC 28.7 (*)    RBC 2.35 (*)    Hemoglobin 7.2 (*)    HCT 20.8 (*)    RDW 25.7 (*)    Platelets 436 (*)    All other components within normal limits  PROTIME-INR - Abnormal; Notable for the following:    Prothrombin Time 15.5 (*)    All other components within normal limits   RETICULOCYTES - Abnormal; Notable for the following:    Retic Ct Pct >23.0 (*)    RBC. 2.35 (*)    All other components within normal limits  I-STAT TROPOININ, ED  I-STAT BETA HCG BLOOD, ED ([facility], [facility], [facility] ONLY)    Imaging Review Dg Chest 2 View  03/15/2016  CLINICAL DATA:  Chest tightness and dyspnea, onset at rest 4 hours ago. EXAM: CHEST  2 VIEW COMPARISON:  03/08/2016. FINDINGS: Mildly prominent cardiac silhouette. The lungs are clear. No pleural effusions. Unremarkable hilar and mediastinal contours. IMPRESSION: Mildly enlarged cardiac silhouette. No acute cardiopulmonary findings. Electronically Signed   By: Andreas [place] M.D.   On: 03/15/2016 01:22   Dorie Rank, MD has personally reviewed and evaluated these images and lab results as part of his medical decision-making.   EKG Interpretation   Date/Time:  Saturday Mar 15 2016 00:52:56 EDT Ventricular Rate:  79 PR Interval:  167 QRS Duration: 98 QT Interval:  408 QTC Calculation: 468 R Axis:   79 Text Interpretation:  Sinus rhythm No old tracing to compare Confirmed by  Kaziah Krizek  MD-J, Ziaire Bieser [id number]) on 03/15/2016 12:58:16 AM      MDM  Final diagnoses:  Sickle cell crisis [facility])    Patient presents to the emergency room with symptoms of  extremity pain she feels is similar to her sickle cell crisis. She is also having chest pain that is atypical for her. Chest x-ray does not show evidence of pneumonia.  I discussed the possibility of evaluating for pulmonary embolism. Patient feels like the symptoms are more in her ribs. I have ordered a CT angiogram chest however were having difficulty with adequate IV access.  Plan on admission the hospital for further treatment of her sickle cell crisis. Continue to follow her chest pain.  More likely related to her sickle. Could consider doing a VQ scan for unable to get an adequate IV.  I personally performed the services described in this documentation, which was scribed in my presence.  The  recorded information has been reviewed and is accurate.   Dorie Rank, MD 03/15/16 [phone]

## 2016-03-15 NOTE — ED Notes (Signed)
IV attempt x2 unsuccessful. Charge requested for assistance.

## 2016-03-15 NOTE — Progress Notes (Signed)
Patient came in with severe right arm, mid lower chest, lower rib cage and right lower extremities pain, level 10/10. Dilaudid PCA started and IV bolus of 0.3 mg given via PCA pump.

## 2016-03-15 NOTE — H&P (Addendum)
[facility] Admission History and Physical       Brittney Rocha [id number] DOB: 06-Mar-1986 DOA: 03/15/2016  Referring physician: EDP PCP: No primary care provider on file.  Specialists:   Chief Complaint: Chest and Rib Pain and Pain in legs and Arm  HPI: Brittney Rocha is a 30 y.o. female with a history of Sickle Cell Disease who presents to the ED with complaints of pain in her right elbow, both knee, and in her chest and ribs x 4 hours.  The pain is 10/10.    She denies any fevers or chills and denies any SOB or nausea or vomiting.  A chest X-ray was a performed in the Ed and was negative for infiltrates.   She was medicated in the ED for pain and had minimal relief.   She was referred for admission.   Of Note: She receives her care in [place].      Review of Systems:  Constitutional: No Weight Loss, No Weight Gain, Night Sweats, Fevers, Chills, Dizziness, Light Headedness, Fatigue, or Generalized Weakness HEENT: No Headaches, Difficulty Swallowing,Tooth/Dental Problems,Sore Throat,  No Sneezing, Rhinitis, Ear Ache, Nasal Congestion, or Post Nasal Drip,  Cardio-vascular:  No +Chest pain, Orthopnea, PND, Edema in Lower Extremities, Anasarca, Dizziness, Palpitations  Resp: No Dyspnea, No DOE, No Productive Cough, No Non-Productive Cough, No Hemoptysis, No Wheezing.    GI: No Heartburn, Indigestion, Abdominal Pain, Nausea, Vomiting, Diarrhea, Constipation, Hematemesis, Hematochezia, Melena, Change in Bowel Habits,  Loss of Appetite  GU: No Dysuria, No Change in Color of Urine, No Urgency or Urinary Frequency, No Flank pain.  Musculoskeletal: +Pain in Right Elbow and Both Knees, No Joint Pain or Swelling, No Decreased Range of Motion, No Back Pain.  Neurologic: No Syncope, No Seizures, Muscle Weakness, Paresthesia, Vision Disturbance or Loss, No Diplopia, No Vertigo, No Difficulty Walking,  Skin: No Rash or Lesions. Psych: No Change in Mood or Affect, No Depression or  Anxiety, No Memory loss, No Confusion, or Hallucinations   Past Medical History  Diagnosis Date  . Sickle cell anemia (HCC)      History reviewed. No pertinent past surgical history.    Prior to Admission medications   Medication Sig Start Date End Date Taking? Authorizing Provider  folic acid (FOLVITE) 1 MG tablet Take 1 mg by mouth daily.   Yes Historical Provider, MD  hydroxyurea (HYDREA) 500 MG capsule Take 1,500 mg by mouth daily. May take with food to minimize GI side effects.   Yes Historical Provider, MD  Oxycodone HCl 10 MG TABS Take 10 mg by mouth every 4 (four) hours as needed. For pain. 02/05/16  Yes Historical Provider, MD  OXYCONTIN 10 MG 12 hr tablet Take 10 mg by mouth 2 (two) times daily. 02/11/16  Yes Historical Provider, MD     No Known Allergies    Social History:  reports that she has been smoking.  She does not have any smokeless tobacco history on file. She reports that she does not drink alcohol or use illicit drugs.     No family history on file.    Physical Exam:  GEN:  Thin  30 y.o. African American female examined and in no acute distress; cooperative with exam Filed Vitals:   03/15/16 0205 03/15/16 0328 03/15/16 0400 03/15/16 0401  BP:  128/75 110/72 110/72  Pulse:  85 81 81  TempSrc:      Resp:  16 19 20   Weight: 55.792 kg (123 lb)     SpO2:  93% 97% 98%   Blood pressure 110/72, pulse 81, resp. rate 20, weight 55.792 kg (123 lb), last menstrual period 03/10/2016, SpO2 98 %. PSYCH: She is alert and oriented x4; does not appear anxious does not appear depressed; affect is normal HEENT: Normocephalic and Atraumatic, Mucous membranes pink; PERRLA; EOM intact; Fundi:  Benign;  No scleral icterus, Nares: Patent, Oropharynx: Clear, Fair Dentition,    Neck:  FROM, No Cervical Lymphadenopathy nor Thyromegaly or Carotid Bruit; No JVD; Breasts:: Not examined CHEST WALL: No tenderness CHEST: Normal respiration, clear to auscultation bilaterally HEART:  Regular rate and rhythm; no murmurs rubs or gallops BACK: No kyphosis or scoliosis; No CVA tenderness ABDOMEN: Positive Bowel Sounds, Scaphoid, Soft Non-Tender, No Rebound or Guarding; No Masses, No Organomegaly. Rectal Exam: Not done EXTREMITIES: No Cyanosis, Clubbing, or Edema; No Ulcerations. Genitalia: not examined PULSES: 2+ and symmetric SKIN: Normal hydration no rash or ulceration CNS:  Alert and Oriented x 4, No Focal Deficits Vascular: pulses palpable throughout    Labs on Admission:  Basic Metabolic Panel:  Recent Labs Lab 03/08/16 0649 03/15/16 0102  NA 137 140  K 4.1 3.6  CL 105 104  CO2 23 26  GLUCOSE 100* 122*  BUN 5* 7  CREATININE 0.40* 0.33*  CALCIUM 9.5 9.9   Liver Function Tests: No results for input(s): AST, ALT, ALKPHOS, BILITOT, PROT, ALBUMIN in the last 168 hours. No results for input(s): LIPASE, AMYLASE in the last 168 hours. No results for input(s): AMMONIA in the last 168 hours. CBC:  Recent Labs Lab 03/08/16 0649 03/15/16 0102  WBC 19.4* 28.7*  NEUTROABS 13.0*  --   HGB 7.5* 7.2*  HCT 21.0* 20.8*  MCV 87.9 88.5  PLT 440* 436*   Cardiac Enzymes: No results for input(s): CKTOTAL, CKMB, CKMBINDEX, TROPONINI in the last 168 hours.  BNP (last 3 results) No results for input(s): BNP in the last 8760 hours.  ProBNP (last 3 results) No results for input(s): PROBNP in the last 8760 hours.  CBG: No results for input(s): GLUCAP in the last 168 hours.  Radiological Exams on Admission: Dg Chest 2 View  03/15/2016  CLINICAL DATA:  Chest tightness and dyspnea, onset at rest 4 hours ago. EXAM: CHEST  2 VIEW COMPARISON:  03/08/2016. FINDINGS: Mildly prominent cardiac silhouette. The lungs are clear. No pleural effusions. Unremarkable hilar and mediastinal contours. IMPRESSION: Mildly enlarged cardiac silhouette. No acute cardiopulmonary findings. Electronically Signed   By: Andreas Newport M.D.   On: 03/15/2016 01:22     EKG: Independently  reviewed. Normal sinus Rhythm rate = 79 No Acute S-T Changes    ASSESSMENT/PLAN:     Sickle Cell Pain Crisis   PCA Dilaudid for Pain   Toradol IV    IVFS    Chest Pain   Telemetry Monitoring   Cycle Troponins       Leukocytosis- Acute Pnhase Reactant   Monitor Trend    Thrombocytosis- Acute Phase Reactant   Monitor Trend    Code Status:     FULL CODE       Family Communication:   No Family Present, Significant Other at Bedside Disposition Plan:    Inpatient  Observation Status        Time spent:  41 Minutes      Theressa Millard [facility] Pager [phone]   If 7AM -7PM Please Contact the Day Rounding Team MD for [facility]  If 7PM-7AM, Please Contact Night-Floor Coverage  [email or web address] Password [email or web address] 03/15/2016, 5:16 AM  ADDENDUM:   Patient was seen and examined on 03/15/2016

## 2016-03-15 NOTE — Progress Notes (Signed)
IV team placed a new PIV. Old PIV was removed.

## 2016-03-15 NOTE — ED Notes (Addendum)
Pt states onset of chest tightness and sob while at rest 4 hours ago. Hx of sickle cell

## 2016-03-15 NOTE — ED Notes (Signed)
MD aware we are unable to obtain adequate access for CT angio scan at this time.

## 2016-03-15 NOTE — Progress Notes (Signed)
SICKLE CELL SERVICE PROGRESS NOTE  Brittney Rocha [id number] DOB: 23-Dec-1985 DOA: 03/15/2016 PCP: No primary care provider on file.  Assessment/Plan: Principal Problem:   Sickle cell pain crisis ([place]) Active Problems:   Leukocytosis   Thrombocytosis (HCC)   Chest pain  1. Hb SS with crisis: Patient's current regimen of treatment is significantly underdosed for patient who is opiate tolerant. I have adjusted her PCA to advanced await based dose. I have also schedule clinician assisted doses and will reevaluate the effectiveness in about 3-4 hours. Also increase Toradol to 30 mg in consideration of patient's weight. 2. Leukocytosis: The patient doesn't show any overt symptoms of infection however will check a urinalysis to ensure the patient does not have a urinary tract infection. Will recheck white blood cell count tomorrow. 3. Anemia of chronic disease: I reviewed the patient's record shows that she has a baseline hemoglobin of about 8 g/dL. Currently she is hemodynamically stable and I will continue to monitor her hemoglobin. Continue hydroxyurea for now. 4. Chronic pain: The patient is very opiate tolerant using OxyContin 10 mg every 12 hours and in the last 2 weeks she reports that she has used her immediate release oxycodone 10 mg every 4 hours. I will continue her OxyContin 10 mg every 12 hours to manage her chronic pain.   Code Status: Full Code Family Communication: N/A Disposition Plan: Not yet ready for discharge  [place].  Pager [phone]. If 7PM-7AM, please contact night-coverage.  03/15/2016, 10:44 AM  LOS: 0 days   Interim History: This is a 30 year old young woman with hemoglobin SS who is new to this institution. I have reviewed her records provided [facility] in [place] which shows that she has a baseline hemoglobin of 8 g/dL. She also has a history of CVA, DVT, acute chest syndrome and cholecystectomy in the past. She usually takes OxyContin 10 mg every  12 hours and immediate release oxycodone 10 mg every 4 hours when necessary. A review of the [place] controlled substance reporting system shows that she's had recent prescription filled. The patient reports that she was in her usual state of health. She states that she had a car ride from [place] to [place] and while on the way here had a sudden onset of pain in her ribs right arm and right leg. She states that the pain in her rib cage was unlike any pain that she felt before. She did not have any shortness of breath or difficulty breathing. The pain was an intensity of 10 out of 10 and that she came straight to the emergency room for evaluation. This morning she still reports her pain is 10 out of 10 and localized to her rib cage right arm and right leg. She denies any associated symptoms of shortness of breath, fever, cough or any focal neurological deficits. Present at the bedside is her significant other who is also engaged in the conversation.  Consultants:  None  Procedures:  None  Antibiotics:  None   Objective: Filed Vitals:   03/15/16 0401 03/15/16 0540 03/15/16 0621 03/15/16 0652  BP: 110/72 120/45 121/66   Pulse: 81 90 85   Temp:   98.1 F (36.7 C)   TempSrc:   Oral   Resp: 20 20 20 16   Height:   5\' 4"  (1.626 m)   Weight:   122 lb 4.8 oz (55.475 kg)   SpO2: 98% 98% 99% 97%   Weight change:  No intake or output data in the 24  hours ending 03/15/16 1044  General: Alert, awake, oriented x3, in moderate distress secondary to pain.  HEENT: [place]/AT PEERL, EOMI, mild icterus Neck: Trachea midline,  no masses, no thyromegal,y no JVD, no carotid bruit OROPHARYNX:  Moist, No exudate/ erythema/lesions.  Heart: Regular rate and rhythm, without murmurs, rubs, gallops, PMI non-displaced, no heaves or thrills on palpation.  Lungs: Clear to auscultation, no wheezing or rhonchi noted. No increased vocal fremitus resonant to percussion  Abdomen: Soft, nontender,  nondistended, positive bowel sounds, no masses no hepatosplenomegaly noted.  Neuro: No focal neurological deficits noted cranial nerves II through XII grossly intact.  Strength at functional baseline in bilateral upper and lower extremities. Musculoskeletal: No warmth swelling or erythema around joints, no spinal tenderness noted. Psychiatric: Patient alert and oriented x3, good insight and cognition, good recent to remote recall.    Data Reviewed: Basic Metabolic Panel:  Recent Labs Lab 03/15/16 0102 03/15/16 0659  NA 140 138  K 3.6 4.3  CL 104 104  CO2 26 24  GLUCOSE 122* 116*  BUN 7 7  CREATININE 0.33* 0.38*  CALCIUM 9.9 9.3  MG  --  1.8   Liver Function Tests:  Recent Labs Lab 03/15/16 0659  AST 42*  ALT 18  ALKPHOS 49  BILITOT 5.4*  PROT 7.7  ALBUMIN 4.6   No results for input(s): LIPASE, AMYLASE in the last 168 hours. No results for input(s): AMMONIA in the last 168 hours. CBC:  Recent Labs Lab 03/15/16 0102 03/15/16 0726  WBC 28.7* 30.9*  NEUTROABS  --  PENDING  HGB 7.2* 6.6*  HCT 20.8* 18.8*  MCV 88.5 87.9  PLT 436* 424*   Cardiac Enzymes: No results for input(s): CKTOTAL, CKMB, CKMBINDEX, TROPONINI in the last 168 hours. BNP (last 3 results) No results for input(s): BNP in the last 8760 hours.  ProBNP (last 3 results) No results for input(s): PROBNP in the last 8760 hours.  CBG: No results for input(s): GLUCAP in the last 168 hours.  No results found for this or any previous visit (from the past 240 hour(s)).   Studies: Dg Chest 2 View  03/15/2016  CLINICAL DATA:  Chest tightness and dyspnea, onset at rest 4 hours ago. EXAM: CHEST  2 VIEW COMPARISON:  03/08/2016. FINDINGS: Mildly prominent cardiac silhouette. The lungs are clear. No pleural effusions. Unremarkable hilar and mediastinal contours. IMPRESSION: Mildly enlarged cardiac silhouette. No acute cardiopulmonary findings. Electronically Signed   By: Andreas Newport M.D.   On: 03/15/2016  01:22   Dg Chest 2 View  03/08/2016  CLINICAL DATA:  Sickle cell crisis EXAM: CHEST  2 VIEW COMPARISON:  None. FINDINGS: Normal heart size and mediastinal contours. No acute infiltrate or edema. No effusion or pneumothorax. Cholecystectomy clips. No acute osseous finding. IMPRESSION: Negative chest. Electronically Signed   By: Monte Fantasia M.D.   On: 03/08/2016 06:27    Scheduled Meds: . dextrose 5 % and 0.45% NaCl   Intravenous STAT  . enoxaparin (LOVENOX) injection  40 mg Subcutaneous Q24H  . folic acid  1 mg Oral Daily  . HYDROmorphone   Intravenous Q4H  .  HYDROmorphone (DILAUDID) injection  2 mg Intravenous Q2H  . hydroxyurea  1,500 mg Oral Daily  . ketorolac  15 mg Intravenous Q6H  . senna-docusate  1 tablet Oral BID   Continuous Infusions: . sodium chloride 125 mL/hr at 03/15/16 0221    Time spent 40 minutes. There is a 50% of time spent in assessment, counseling and coordination of  care.

## 2016-03-16 LAB — CBC WITH DIFFERENTIAL/PLATELET
BASOS PCT: 0 %
Basophils Absolute: 0 10*3/uL (ref 0.0–0.1)
EOS ABS: 0 10*3/uL (ref 0.0–0.7)
EOS PCT: 0 %
HCT: 16.1 % — ABNORMAL LOW (ref 36.0–46.0)
Hemoglobin: 5.8 g/dL — CL (ref 12.0–15.0)
LYMPHS ABS: 3.5 10*3/uL (ref 0.7–4.0)
Lymphocytes Relative: 15 %
MCH: 31 pg (ref 26.0–34.0)
MCHC: 36 g/dL (ref 30.0–36.0)
MCV: 86.1 fL (ref 78.0–100.0)
MONOS PCT: 7 %
Monocytes Absolute: 1.7 10*3/uL — ABNORMAL HIGH (ref 0.1–1.0)
NEUTROS PCT: 78 %
Neutro Abs: 18.8 10*3/uL — ABNORMAL HIGH (ref 1.7–7.7)
PLATELETS: 373 10*3/uL (ref 150–400)
RBC: 1.87 MIL/uL — ABNORMAL LOW (ref 3.87–5.11)
RDW: 25.1 % — ABNORMAL HIGH (ref 11.5–15.5)
WBC: 24 10*3/uL — AB (ref 4.0–10.5)

## 2016-03-16 LAB — ABO/RH: ABO/RH(D): O POS

## 2016-03-16 LAB — PREPARE RBC (CROSSMATCH)

## 2016-03-16 MED ORDER — SODIUM CHLORIDE 0.9 % IV SOLN
Freq: Once | INTRAVENOUS | Status: AC
  Administered 2016-03-16: 10:00:00 via INTRAVENOUS

## 2016-03-16 NOTE — Progress Notes (Signed)
Assumed care of pt from previous RN. Agree with previous nurse's assessment.

## 2016-03-16 NOTE — Progress Notes (Signed)
PHARMACIST - PHYSICIAN COMMUNICATION DR:  Zigmund Daniel CONCERNING:  Hydrea  Hydroxyurea will be place on hold due to P&T approved hold criteria listed below.  Patient's Hgb today is 5.8.  Hydroxyurea (Hydrea) hold criteria  ANC < 2  Pltc < 80K in sickle-cell patients; < 100K in other patients  Hgb <= 6 in sickle-cell patients; < 8 in other patients  Reticulocytes < 80K when Hgb < 9  Plan:  Recommend following daily CBC and resume hydroxyurea when appropriate and once pt no longer meets Hold Criteria.   Hershal Coria, PharmD, BCPS Pager: [phone] 03/16/2016 11:27 AM

## 2016-03-16 NOTE — Progress Notes (Signed)
Dr. Zigmund Daniel called this RN to room while examining patient.  Patient's PCA pump was off with 'set key to program' screen on and ETCO2 channel reading 'error'.  Patient denies tampering with pump.  Pump is now running and set back to PCA settings per order.  Will continue to monitor patient closely.

## 2016-03-16 NOTE — Progress Notes (Signed)
SICKLE CELL SERVICE PROGRESS NOTE  Shantisha Urben [id number] DOB: Mar 11, 1986 DOA: 03/15/2016 PCP: No primary care provider on file.  Assessment/Plan: Principal Problem:   Sickle cell pain crisis ([place]) Active Problems:   Leukocytosis   Thrombocytosis (HCC)   Chest pain   Anemia of chronic disease  1. Hb SS with crisis: I will continue her current regimen of pain medications. As this seems to be working to improve her pain. Encourage the patient to start ambulating around the unit and in particular so that we may check her a return saturations. 2. Leukocytosis: Urine shows positive nitrites although she has no white blood cells in the urine. She denies dysuria so I will hold on treating for urinary tract infection at this time. 3.  Anemia of chronic disease: I reviewed the patient's record shows that she has a baseline hemoglobin of about 8 g/dL. Currently she is hemodynamically stable and I will continue to monitor her hemoglobin I evaluate reticulocytosis. Hold hydroxyurea for low hemoglobin. I have ordered blood to be prepared for the patient if she does not show an improvement in her hemoglobin over the next 24 hours we'll consider transfusing one unit RBC's.  4. Chronic pain: The patient is very opiate tolerant using OxyContin 10 mg every 12 hours and in the last 2 weeks she reports that she has used her immediate release oxycodone 10 mg every 4 hours. I will continue her OxyContin 10 mg every 12 hours to manage her chronic pain. 5. Constipation: The patient has not had a bowel movement in approximately 3 days. She prefers MiraLAX as her laxative choice. I have asked her nurse to administer to her today.   Code Status: Full Code Family Communication: N/A Disposition Plan: Not yet ready for discharge  [place].  Pager [phone]. If 7PM-7AM, please contact night-coverage.  03/16/2016, 2:59 PM  LOS: 1 day   Interim History: This is a 30 year old young woman with hemoglobin SS  who is new to this institution. I have reviewed her records provided [facility] in [place] which shows that she has a baseline hemoglobin of 8 g/dL. She also has a history of CVA, DVT, acute chest syndrome and cholecystectomy in the past. She usually takes OxyContin 10 mg every 12 hours and immediate release oxycodone 10 mg every 4 hours when necessary. A review of the [place] controlled substance reporting system shows that she's had recent prescription filled. The patient reports that she was in her usual state of health. She states that she had a car ride from [place] to [place] and while on the way here had a sudden onset of pain in her ribs right arm and right leg. She states that the pain in her rib cage was unlike any pain that she felt before. She did not have any shortness of breath or difficulty breathing.  Today patient reports that her pain is 7 out of 10 mostly localized to her right upper extremity. She offers that the pain in her chest wall and ribs has improved. Patient reports that her hemoglobin has been slows for before transfused in the past. 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  Consultants:  None  Procedures:  None  Antibiotics:  None   Objective: Filed Vitals:   03/16/16 0437 03/16/16 0800 03/16/16 1055 03/16/16 1200  BP:   128/88   Pulse:   89   Temp:   99.8 F (37.7 C)   TempSrc:   Oral   Resp: 17 19  20 23  Height:      Weight:      SpO2: 100% 94% 98% 97%   Weight change:   Intake/Output Summary (Last 24 hours) at 03/16/16 1459 Last data filed at 03/16/16 1453  Gross per 24 hour  Intake   1106 ml  Output      0 ml  Net   1106 ml    General: Alert, awake, oriented x3, inMild  distress secondary to pain.  HEENT: [place]/AT PEERL, EOMI, mild icterus Neck: Trachea midline,  no masses, no thyromegal,y no JVD, no carotid bruit OROPHARYNX:  Moist, No exudate/ erythema/lesions.  Heart: Regular rate and rhythm,  without murmurs, rubs, gallops, PMI non-displaced, no heaves or thrills on palpation.  Lungs: Clear to auscultation, no wheezing or rhonchi noted. No increased vocal fremitus resonant to percussion  Abdomen: Soft, nontender, nondistended, positive bowel sounds, no masses no hepatosplenomegaly noted.  Neuro: No focal neurological deficits noted cranial nerves II through XII grossly intact.  Strength at functional baseline in bilateral upper and lower extremities. Musculoskeletal: No warmth swelling or erythema around joints, no spinal tenderness noted. Psychiatric: Patient alert and oriented x3, good insight and cognition, good recent to remote recall.    Data Reviewed: Basic Metabolic Panel:  Recent Labs Lab 03/15/16 0102 03/15/16 0659  NA 140 138  K 3.6 4.3  CL 104 104  CO2 26 24  GLUCOSE 122* 116*  BUN 7 7  CREATININE 0.33* 0.38*  CALCIUM 9.9 9.3  MG  --  1.8   Liver Function Tests:  Recent Labs Lab 03/15/16 0659  AST 42*  ALT 18  ALKPHOS 49  BILITOT 5.4*  PROT 7.7  ALBUMIN 4.6   No results for input(s): LIPASE, AMYLASE in the last 168 hours. No results for input(s): AMMONIA in the last 168 hours. CBC:  Recent Labs Lab 03/15/16 0102 03/15/16 0726 03/16/16 0431  WBC 28.7* 30.9* 24.0*  NEUTROABS  --  25.3* 18.8*  HGB 7.2* 6.6* 5.8*  HCT 20.8* 18.8* 16.1*  MCV 88.5 87.9 86.1  PLT 436* 424* 373   Cardiac Enzymes: No results for input(s): CKTOTAL, CKMB, CKMBINDEX, TROPONINI in the last 168 hours. BNP (last 3 results) No results for input(s): BNP in the last 8760 hours.  ProBNP (last 3 results) No results for input(s): PROBNP in the last 8760 hours.  CBG: No results for input(s): GLUCAP in the last 168 hours.  No results found for this or any previous visit (from the past 240 hour(s)).   Studies: Dg Chest 2 View  03/15/2016  CLINICAL DATA:  Chest tightness and dyspnea, onset at rest 4 hours ago. EXAM: CHEST  2 VIEW COMPARISON:  03/08/2016. FINDINGS:  Mildly prominent cardiac silhouette. The lungs are clear. No pleural effusions. Unremarkable hilar and mediastinal contours. IMPRESSION: Mildly enlarged cardiac silhouette. No acute cardiopulmonary findings. Electronically Signed   By: Andreas Newport M.D.   On: 03/15/2016 01:22   Dg Chest 2 View  03/08/2016  CLINICAL DATA:  Sickle cell crisis EXAM: CHEST  2 VIEW COMPARISON:  None. FINDINGS: Normal heart size and mediastinal contours. No acute infiltrate or edema. No effusion or pneumothorax. Cholecystectomy clips. No acute osseous finding. IMPRESSION: Negative chest. Electronically Signed   By: Monte Fantasia M.D.   On: 03/08/2016 06:27    Scheduled Meds: . enoxaparin (LOVENOX) injection  40 mg Subcutaneous Q24H  . feeding supplement (ENSURE ENLIVE)  237 mL Oral BID BM  . folic acid  1 mg Oral Daily  .  HYDROmorphone   Intravenous Q4H  .  HYDROmorphone (DILAUDID) injection  2 mg Intravenous Q2H  . ketorolac  30 mg Intravenous Q6H  . senna-docusate  1 tablet Oral BID   Continuous Infusions: . sodium chloride 125 mL/hr at 03/16/16 0602    Time spent 30 minutes. There is a 50% of time spent in assessment, counseling and coordination of care.

## 2016-03-16 NOTE — Progress Notes (Signed)
The remainder of Hydromorphone from the previous PCA syringe in the PCA pump was used to run through the new PCA tubing before being connected to the patient. The old PCA tubing was discarded.  There was another RN present while the new PCA was being placed in the PCA pump,Samantha Seagraves.

## 2016-03-17 DIAGNOSIS — K5900 Constipation, unspecified: Secondary | ICD-10-CM

## 2016-03-17 LAB — LACTATE DEHYDROGENASE: LDH: 508 U/L — AB (ref 98–192)

## 2016-03-17 LAB — BASIC METABOLIC PANEL
ANION GAP: 9 (ref 5–15)
BUN: 10 mg/dL (ref 6–20)
CHLORIDE: 101 mmol/L (ref 101–111)
CO2: 24 mmol/L (ref 22–32)
Calcium: 8.8 mg/dL — ABNORMAL LOW (ref 8.9–10.3)
Creatinine, Ser: 0.36 mg/dL — ABNORMAL LOW (ref 0.44–1.00)
GFR calc Af Amer: >60 mL/min (ref >60)
Glucose, Bld: 97 mg/dL (ref 65–99)
POTASSIUM: 4.5 mmol/L (ref 3.5–5.1)
SODIUM: 134 mmol/L — AB (ref 135–145)

## 2016-03-17 LAB — CBC WITH DIFFERENTIAL/PLATELET
BASOS PCT: 0 %
Basophils Absolute: 0 10*3/uL (ref 0.0–0.1)
Eosinophils Absolute: 0 10*3/uL (ref 0.0–0.7)
Eosinophils Relative: 0 %
HEMATOCRIT: 15 % — AB (ref 36.0–46.0)
Hemoglobin: 5.2 g/dL — CL (ref 12.0–15.0)
LYMPHS ABS: 2.2 10*3/uL (ref 0.7–4.0)
Lymphocytes Relative: 11 %
MCH: 29.5 pg (ref 26.0–34.0)
MCHC: 34.7 g/dL (ref 30.0–36.0)
MCV: 85.2 fL (ref 78.0–100.0)
MONO ABS: 2 10*3/uL — AB (ref 0.1–1.0)
Monocytes Relative: 10 %
NEUTROS PCT: 79 %
Neutro Abs: 15.5 10*3/uL — ABNORMAL HIGH (ref 1.7–7.7)
PLATELETS: 344 10*3/uL (ref 150–400)
RBC: 1.76 MIL/uL — ABNORMAL LOW (ref 3.87–5.11)
RDW: 22 % — ABNORMAL HIGH (ref 11.5–15.5)
WBC: 19.7 10*3/uL — AB (ref 4.0–10.5)

## 2016-03-17 LAB — RETICULOCYTES
RBC.: 1.76 MIL/uL — ABNORMAL LOW (ref 3.87–5.11)
Retic Ct Pct: >23 % — ABNORMAL HIGH (ref 0.4–3.1)

## 2016-03-17 MED ORDER — SODIUM CHLORIDE 0.9 % IV SOLN
Freq: Once | INTRAVENOUS | Status: AC
  Administered 2016-03-17: 13:00:00 via INTRAVENOUS

## 2016-03-17 MED ORDER — HYDROMORPHONE HCL 2 MG/ML IJ SOLN
2.0000 mg | INTRAMUSCULAR | Status: DC | PRN
  Administered 2016-03-17 – 2016-03-19 (×15): 2 mg via INTRAVENOUS
  Filled 2016-03-17 (×14): qty 1

## 2016-03-17 NOTE — Progress Notes (Signed)
SICKLE CELL SERVICE PROGRESS NOTE  Demie Deruyter [id number] DOB: 07-Feb-1986 DOA: 03/15/2016 PCP: No primary care provider on file.  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 Acute hemolytic anemia in setting of anemia of chronic disease: Hemoglobin down to 5.2 g/dL today. Patient shows clinical sequela of hemolysis with visible jaundice. Will transfuse 1 unit of blood and reassess hemoglobin in the morning. 2. Hb SS with crisis: Patient's pain significantly decreased today. I will continue the PCA at current dosing and change her clinician assisted doses to an as-needed basis. I anticipate that she will be transitioned to oral analgesics tomorrow and can likely be discharge by tomorrow afternoon if she tolerates her pain well. 3. Leukocytosis: No evidence of infection. Urine shows positive nitrites although she has no white blood cells in the urine. She denies dysuria so I will hold on treating for urinary tract infection at this time. I suspect this is related to her crisis and increased marrow turnover in response to the anemia. 4.  Chronic pain: The patient is very opiate tolerant using OxyContin 10 mg every 12 hours and in the last 2 weeks she reports that she has used her immediate release oxycodone 10 mg every 4 hours. I will continue her OxyContin 10 mg every 12 hours to manage her chronic pain. 5. Constipation: The patient has not had a bowel movement in approximately 3 days. She prefers MiraLAX as her laxative choice. I have asked her nurse to administer to her today.   Code Status: Full Code Family Communication: N/A Disposition Plan: Not yet ready for discharge  [place].  Pager [phone]. If 7PM-7AM, please contact night-coverage.  03/17/2016, 4:19 PM  LOS: 2 days   Interim History: This is a 30 year old young woman with hemoglobin SS who  is new to this institution. I have reviewed her records provided [facility] in [place] which shows that she has a baseline hemoglobin of 8 g/dL. She also has a history of CVA, DVT, acute chest syndrome and cholecystectomy in the past. She usually takes OxyContin 10 mg every 12 hours and immediate release oxycodone 10 mg every 4 hours when necessary. A review of the [place] controlled substance reporting system shows that she's had recent prescription filled. The patient reports that she was in her usual state of health. She states that she had a car ride from [place] to [place] and while on the way here had a sudden onset of pain in her ribs right arm and right leg. She states that the pain in her rib cage was unlike any pain that she felt before. She did not have any shortness of breath or difficulty breathing.  Today patient reports that she feels better. She rates her pain still 7 out of 10 in the right upper extremity. She has used 19.46 mg with 46/23: Dem[facility]/deliveries and 24 mg of Dilaudid in the last 24 hours.   Consultants:  None  Procedures:  None  Antibiotics:  None   Objective: Filed Vitals:   03/17/16 1242 03/17/16 1315 03/17/16 1334 03/17/16 1511  BP: 129/51 109/39 109/54 112/57  Pulse: 111 113 103 93  Temp: 98.4 F (36.9 C) 98.7 F (37.1 C) 98.2 F (36.8 C) 98.9 F (37.2 C)  TempSrc: Axillary Oral Oral Oral  Resp: 19 18 18 22   Height:      Weight:  SpO2: 91% 92% 94% 95%   Weight change:   Intake/Output Summary (Last 24 hours) at 03/17/16 1619 Last data filed at 03/17/16 1510  Gross per 24 hour  Intake  317.5 ml  Output      0 ml  Net  317.5 ml    General: Alert, awake, oriented x3, in no apparent distress.  HEENT: [place]/AT PEERL, EOMI, mild to moderate icterus Neck: Trachea midline,  no masses, no thyromegal,y no JVD, no carotid bruit OROPHARYNX:  Moist, No exudate/ erythema/lesions.  Heart: Regular rate and rhythm, without murmurs,  rubs, gallops.  Lungs: Clear to auscultation, no wheezing or rhonchi noted. No increased vocal fremitus resonant to percussion  Abdomen: Soft, nontender, nondistended, positive bowel sounds, no masses no hepatosplenomegaly noted.  Neuro: No focal neurological deficits noted cranial nerves II through XII grossly intact.  Strength at functional baseline in bilateral upper and lower extremities. Musculoskeletal: No warmth swelling or erythema around joints, no spinal tenderness noted. Psychiatric: Patient alert and oriented x3, good insight and cognition, good recent to remote recall.    Data Reviewed: Basic Metabolic Panel:  Recent Labs Lab 03/15/16 0102 03/15/16 0659 03/17/16 0447  NA 140 138 134*  K 3.6 4.3 4.5  CL 104 104 101  CO2 26 24 24   GLUCOSE 122* 116* 97  BUN 7 7 10   CREATININE 0.33* 0.38* 0.36*  CALCIUM 9.9 9.3 8.8*  MG  --  1.8  --    Liver Function Tests:  Recent Labs Lab 03/15/16 0659  AST 42*  ALT 18  ALKPHOS 49  BILITOT 5.4*  PROT 7.7  ALBUMIN 4.6   No results for input(s): LIPASE, AMYLASE in the last 168 hours. No results for input(s): AMMONIA in the last 168 hours. CBC:  Recent Labs Lab 03/15/16 0102 03/15/16 0726 03/16/16 0431 03/17/16 0447  WBC 28.7* 30.9* 24.0* 19.7*  NEUTROABS  --  25.3* 18.8* 15.5*  HGB 7.2* 6.6* 5.8* 5.2*  HCT 20.8* 18.8* 16.1* 15.0*  MCV 88.5 87.9 86.1 85.2  PLT 436* 424* 373 344   Cardiac Enzymes: No results for input(s): CKTOTAL, CKMB, CKMBINDEX, TROPONINI in the last 168 hours. BNP (last 3 results) No results for input(s): BNP in the last 8760 hours.  ProBNP (last 3 results) No results for input(s): PROBNP in the last 8760 hours.  CBG: No results for input(s): GLUCAP in the last 168 hours.  No results found for this or any previous visit (from the past 240 hour(s)).   Studies: Dg Chest 2 View  03/15/2016  CLINICAL DATA:  Chest tightness and dyspnea, onset at rest 4 hours ago. EXAM: CHEST  2 VIEW  COMPARISON:  03/08/2016. FINDINGS: Mildly prominent cardiac silhouette. The lungs are clear. No pleural effusions. Unremarkable hilar and mediastinal contours. IMPRESSION: Mildly enlarged cardiac silhouette. No acute cardiopulmonary findings. Electronically Signed   By: Andreas Newport M.D.   On: 03/15/2016 01:22   Dg Chest 2 View  03/08/2016  CLINICAL DATA:  Sickle cell crisis EXAM: CHEST  2 VIEW COMPARISON:  None. FINDINGS: Normal heart size and mediastinal contours. No acute infiltrate or edema. No effusion or pneumothorax. Cholecystectomy clips. No acute osseous finding. IMPRESSION: Negative chest. Electronically Signed   By: Monte Fantasia M.D.   On: 03/08/2016 06:27    Scheduled Meds: . enoxaparin (LOVENOX) injection  40 mg Subcutaneous Q24H  . feeding supplement (ENSURE ENLIVE)  237 mL Oral BID BM  . folic acid  1 mg Oral Daily  . HYDROmorphone   Intravenous  Q4H  . ketorolac  30 mg Intravenous Q6H  . senna-docusate  1 tablet Oral BID   Continuous Infusions: . sodium chloride 125 mL/hr at 03/17/16 0903    Time spent 25 minutes. There is a 50% of time spent in assessment, counseling and coordination of care.

## 2016-03-17 NOTE — Progress Notes (Signed)
Nutrition Brief Note  Patient identified on the Malnutrition Screening Tool (MST) Report  Patient eating 25-100% of meals. Patient has been ordered Ensure supplements, supplement at bedside during visit.  Wt Readings from Last 15 Encounters:  03/15/16 122 lb 4.8 oz (55.475 kg)    Body mass index is 20.98 kg/(m^2). Patient meets criteria for normal range based on current BMI.   Current diet order is Regular, patient is consuming approximately 25-100% of meals at this time. Labs and medications reviewed.   No nutrition interventions warranted at this time. If nutrition issues arise, please consult RD.   Clayton Bibles, MS, RD, LDN Pager: [phone] After Hours Pager: [phone]

## 2016-03-18 ENCOUNTER — Inpatient Hospital Stay (HOSPITAL_COMMUNITY): Payer: Medicaid Other

## 2016-03-18 DIAGNOSIS — J9601 Acute respiratory failure with hypoxia: Secondary | ICD-10-CM

## 2016-03-18 LAB — CBC WITH DIFFERENTIAL/PLATELET
BASOS ABS: 0 10*3/uL (ref 0.0–0.1)
BASOS PCT: 0 %
Eosinophils Absolute: 0.2 10*3/uL (ref 0.0–0.7)
Eosinophils Relative: 1 %
HEMATOCRIT: 15.4 % — AB (ref 36.0–46.0)
HEMOGLOBIN: 5.3 g/dL — AB (ref 12.0–15.0)
LYMPHS PCT: 19 %
Lymphs Abs: 3.2 10*3/uL (ref 0.7–4.0)
MCH: 28 pg (ref 26.0–34.0)
MCHC: 34.4 g/dL (ref 30.0–36.0)
MCV: 81.5 fL (ref 78.0–100.0)
MONO ABS: 2.2 10*3/uL — AB (ref 0.1–1.0)
Monocytes Relative: 13 %
NEUTROS ABS: 11 10*3/uL — AB (ref 1.7–7.7)
NEUTROS PCT: 66 %
PLATELETS: 301 10*3/uL (ref 150–400)
RBC: 1.89 MIL/uL — AB (ref 3.87–5.11)
RDW: 19.6 % — AB (ref 11.5–15.5)
WBC: 16.7 10*3/uL — AB (ref 4.0–10.5)

## 2016-03-18 LAB — RETICULOCYTES
RBC.: 5.26 MIL/uL — AB (ref 3.87–5.11)
RETIC CT PCT: 1.7 % (ref 0.4–3.1)
Retic Count, Absolute: 89.4 10*3/uL (ref 19.0–186.0)

## 2016-03-18 MED ORDER — METHYLPREDNISOLONE SODIUM SUCC 125 MG IJ SOLR
60.0000 mg | Freq: Once | INTRAMUSCULAR | Status: AC
  Administered 2016-03-18: 60 mg via INTRAVENOUS
  Filled 2016-03-18: qty 2

## 2016-03-18 MED ORDER — SODIUM CHLORIDE 0.9 % IV SOLN
Freq: Once | INTRAVENOUS | Status: AC
  Administered 2016-03-18: 15:00:00 via INTRAVENOUS

## 2016-03-18 MED ORDER — OXYCODONE HCL ER 10 MG PO T12A
10.0000 mg | EXTENDED_RELEASE_TABLET | Freq: Two times a day (BID) | ORAL | Status: DC
Start: 2016-03-18 — End: 2016-03-19
  Administered 2016-03-18 – 2016-03-19 (×3): 10 mg via ORAL
  Filled 2016-03-18 (×3): qty 1

## 2016-03-18 MED ORDER — ACETAMINOPHEN 325 MG PO TABS
650.0000 mg | ORAL_TABLET | Freq: Once | ORAL | Status: AC
  Administered 2016-03-18: 650 mg via ORAL
  Filled 2016-03-18: qty 2

## 2016-03-18 NOTE — Care Management Note (Signed)
Case Management Note  Patient Details  Name: Brittney Rocha MRN: [id number] Date of Birth: 05-04-1986  Subjective/Objective:    Pt admitted with Sickle Cell Pain                Action/Plan: Plan to discharge home with no needs.   Expected Discharge Date:   03/22/2016              Expected Discharge Plan:  Home/Self Care  In-House Referral:     Discharge planning Services  CM Consult  Post Acute Care Choice:    Choice offered to:     DME Arranged:    DME Agency:     HH Arranged:    HH Agency:     Status of Service:  In process, will continue to follow  Medicare Important Message Given:    Date Medicare IM Given:    Medicare IM give by:    Date Additional Medicare IM Given:    Additional Medicare Important Message give by:     If discussed at [place] of Stay Meetings, dates discussed:    Additional Comments:  Purcell Mouton, RN 03/18/2016, 11:00 AM

## 2016-03-18 NOTE — Progress Notes (Signed)
1 unit RBC started at 1513. Blood consent signed and dated, VSS, and double check with blood bank and second RN to verify 5 rights of blood/medication administration. Pt stable for transport. Full report given to 3W RN Joelene Millin. RN Curt Bears remaining in room for 1st 15 minutes of transfusion and then pt will be transferred to [facility].

## 2016-03-18 NOTE — Progress Notes (Signed)
Upon returning from receiving a chest xray, that she had hives, swelling, and itching in her right arm. RN assessed the right arm and did note very mild swelling in patches. The pt has not received any new medications via IV this am. The swelling/patches decreased in size while the RN was at the bedside. The pt also reported to the RN that she had numbness in her fingers and that they felt ice cold to her. Upon assessment by RN, the pt's fingers were warm to the touch and had normal capillary refill. The pt then c/o chest tightness, mid sternal. Vital signs obtained and BP 114/59, HR 90, Temp 99.6, O2 sats 100 % on 3L oxygen via nasal cannula. Upon entering the room, the RN did note that the oxygen tubing was not connected to the oxygen set up and the pt's O2 sats were 85% on RA. EKG was obtained and showed NSR. MD made aware of pt complaints and RN findings. Orders received to premedicate with tylenol, benadryl, and solu-medrol prior to blood transfusion. The pt reported resolution of chest tightness and numbness in her fingers after she was back on oxygen for a few minutes. Will continue to monitor pt.  Othella Boyer [facility] 03/18/2016

## 2016-03-18 NOTE — Progress Notes (Signed)
SICKLE CELL SERVICE PROGRESS NOTE  Brittney Rocha [id number] DOB: September 21, 1986 DOA: 03/15/2016 PCP: No primary care provider on file.  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 Hypoxemia:  Pt has developed a hypoxia with saturations at 85% on RA. She has a history of acute chest syndrome on several occasions in the past. She has multiple risk factors for several etiologies- Acute chest, anemia or PE given her recent car travel. I will check a CXR for new infiltrate and transfuse 1 more unit of blood as he Hb is still 5.3 g/dl. If no infiltrate and no resolution of hypoxia after these interventions, will consider CT angiogram to evaluate for PE.  2. Acute hemolytic anemia in setting of anemia of chronic disease: Hemoglobin still at  5.3 g/dL today despite transfusion. In light of her continued hypoxemia, will transfuse 1 more unit of blood and reassess hemoglobin in the morning. 3. Hb SS with crisis: Patient's pain at 7/10  And localized to low back and RUE.  I will transition to oral analgesics today and continue PRN IVP Dilaudid.  Anticipate likely be discharge by tomorrow afternoon if she tolerates her pain well and Hypoxia improved. 4. Leukocytosis: No evidence of infection. Urine shows positive nitrites although she has no white blood cells in the urine. She denies dysuria so I will hold on treating for urinary tract infection at this time. I suspect this is related to her crisis and increased marrow turnover in response to the anemia. 5.  Chronic pain: The patient is very opiate tolerant using OxyContin 10 mg every 12 hours and in the last 2 weeks she reports that she has used her immediate release oxycodone 10 mg every 4 hours. I will continue her OxyContin 10 mg every 12 hours to manage her chronic pain. 6. Constipation: The patient has not had a bowel movement in  approximately 3 days. She prefers MiraLAX as her laxative choice. I have asked her nurse to administer to her today. 7. DVT Prophylaxis: Pt has refused Lovenox and is using SCD's.     Code Status: Full Code Family Communication: N/A Disposition Plan: Not yet ready for discharge  [place].  Pager [phone]. If 7PM-7AM, please contact night-coverage.  03/18/2016, 9:31 AM  LOS: 3 days   Interim History: This is a 30 year old young woman with hemoglobin SS who is new to this institution. I have reviewed her records provided [facility] in [place] which shows that she has a baseline hemoglobin of 8 g/dL. She also has a history of CVA, DVT, acute chest syndrome and cholecystectomy in the past. She usually takes OxyContin 10 mg every 12 hours and immediate release oxycodone 10 mg every 4 hours when necessary. A review of the [place] controlled substance reporting system shows that she's had recent prescription filled. The patient reports that she was in her usual state of health. She states that she had a car ride from  [place] to [place] and while on the way here had a sudden onset of pain in her ribs right arm and right leg. She states that the pain in her rib cage was unlike any pain that she felt before. She did not have any shortness of breath or difficulty breathing.  Today patient reports that she feels better. She rates her pain still 7/10 in the right upper extremity and low back. She has  used 19.46 mg with 46/23: Demands/deliveries and 24 mg of Dilaudid in the last 24 hours.   Consultants:  None  Procedures:  None  Antibiotics:  None   Objective: Filed Vitals:   03/18/16 0625 03/18/16 0700 03/18/16 0705 03/18/16 0855  BP: 106/66     Pulse: 80     Temp: 99.6 F (37.6 C)     TempSrc: Oral     Resp: 22   20  Height:      Weight:      SpO2: 99% 85% 95% 96%   Weight change:   Intake/Output Summary (Last 24 hours) at 03/18/16 0931 Last data filed  at 03/18/16 0916  Gross per 24 hour  Intake 3192.92 ml  Output   3100 ml  Net  92.92 ml    General: Alert, awake, oriented x3, in no apparent distress.  HEENT: [place]/AT PEERL, EOMI, mild to moderate icterus Neck: Trachea midline,  no masses, no thyromegal,y no JVD, no carotid bruit OROPHARYNX:  Moist, No exudate/ erythema/lesions.  Heart: Regular rate and rhythm, without murmurs, rubs, gallops.  Lungs: Clear to auscultation, no wheezing or rhonchi noted. No increased vocal fremitus resonant to percussion  Abdomen: Soft, nontender, nondistended, positive bowel sounds, no masses no hepatosplenomegaly noted.  Neuro: No focal neurological deficits noted cranial nerves II through XII grossly intact.  Strength at functional baseline in bilateral upper and lower extremities. Musculoskeletal: No warmth swelling or erythema around joints, no spinal tenderness noted. Psychiatric: Patient alert and oriented x3, good insight and cognition, good recent to remote recall.    Data Reviewed: Basic Metabolic Panel:  Recent Labs Lab 03/15/16 0102 03/15/16 0659 03/17/16 0447  NA 140 138 134*  K 3.6 4.3 4.5  CL 104 104 101  CO2 26 24 24   GLUCOSE 122* 116* 97  BUN 7 7 10   CREATININE 0.33* 0.38* 0.36*  CALCIUM 9.9 9.3 8.8*  MG  --  1.8  --    Liver Function Tests:  Recent Labs Lab 03/15/16 0659  AST 42*  ALT 18  ALKPHOS 49  BILITOT 5.4*  PROT 7.7  ALBUMIN 4.6   No results for input(s): LIPASE, AMYLASE in the last 168 hours. No results for input(s): AMMONIA in the last 168 hours. CBC:  Recent Labs Lab 03/15/16 0102 03/15/16 0726 03/16/16 0431 03/17/16 0447 03/18/16 0447  WBC 28.7* 30.9* 24.0* 19.7* 16.7*  NEUTROABS  --  25.3* 18.8* 15.5* 11.0*  HGB 7.2* 6.6* 5.8* 5.2* 5.3*  HCT 20.8* 18.8* 16.1* 15.0* 15.4*  MCV 88.5 87.9 86.1 85.2 81.5  PLT 436* 424* 373 344 301   Cardiac Enzymes: No results for input(s): CKTOTAL, CKMB, CKMBINDEX, TROPONINI in the last 168 hours. BNP (last  3 results) No results for input(s): BNP in the last 8760 hours.  ProBNP (last 3 results) No results for input(s): PROBNP in the last 8760 hours.  CBG: No results for input(s): GLUCAP in the last 168 hours.  No results found for this or any previous visit (from the past 240 hour(s)).   Studies: Dg Chest 2 View  03/15/2016  CLINICAL DATA:  Chest tightness and dyspnea, onset at rest 4 hours ago. EXAM: CHEST  2 VIEW COMPARISON:  03/08/2016. FINDINGS: Mildly prominent cardiac silhouette. The lungs are clear. No pleural effusions. Unremarkable hilar and mediastinal contours. IMPRESSION: Mildly enlarged cardiac silhouette. No acute cardiopulmonary findings. Electronically Signed   By: Andreas Newport M.D.   On: 03/15/2016 01:22   Dg Chest 2 View  03/08/2016  CLINICAL DATA:  Sickle cell crisis EXAM: CHEST  2 VIEW COMPARISON:  None. FINDINGS: Normal heart size and mediastinal contours. No acute infiltrate or edema. No effusion or pneumothorax. Cholecystectomy clips. No acute osseous finding. IMPRESSION: Negative chest. Electronically Signed   By: Monte Fantasia M.D.   On: 03/08/2016 06:27    Scheduled Meds: . sodium chloride   Intravenous Once  . enoxaparin (LOVENOX) injection  40 mg Subcutaneous Q24H  . feeding supplement (ENSURE ENLIVE)  237 mL Oral BID BM  . folic acid  1 mg Oral Daily  . HYDROmorphone   Intravenous Q4H  . ketorolac  30 mg Intravenous Q6H  . senna-docusate  1 tablet Oral BID   Continuous Infusions: . sodium chloride 125 mL/hr at 03/18/16 0057    Time spent 25 minutes. There is a 50% of time spent in assessment, counseling and coordination of care.   Reviewed CXR which shows bibasilar atelectasis. Needs tro be more aggressive with incentive spirometer.

## 2016-03-19 DIAGNOSIS — D599 Acquired hemolytic anemia, unspecified: Secondary | ICD-10-CM

## 2016-03-19 DIAGNOSIS — R0789 Other chest pain: Secondary | ICD-10-CM

## 2016-03-19 LAB — BASIC METABOLIC PANEL
ANION GAP: 9 (ref 5–15)
BUN: 8 mg/dL (ref 6–20)
CALCIUM: 8.8 mg/dL — AB (ref 8.9–10.3)
CO2: 22 mmol/L (ref 22–32)
Chloride: 106 mmol/L (ref 101–111)
Glucose, Bld: 114 mg/dL — ABNORMAL HIGH (ref 65–99)
Potassium: 4.4 mmol/L (ref 3.5–5.1)
SODIUM: 137 mmol/L (ref 135–145)

## 2016-03-19 LAB — CBC WITH DIFFERENTIAL/PLATELET
BASOS ABS: 0 10*3/uL (ref 0.0–0.1)
Basophils Relative: 0 %
EOS ABS: 0 10*3/uL (ref 0.0–0.7)
Eosinophils Relative: 0 %
HCT: 20.7 % — ABNORMAL LOW (ref 36.0–46.0)
Hemoglobin: 7.1 g/dL — ABNORMAL LOW (ref 12.0–15.0)
LYMPHS PCT: 17 %
Lymphs Abs: 1.8 10*3/uL (ref 0.7–4.0)
MCH: 28 pg (ref 26.0–34.0)
MCHC: 34.3 g/dL (ref 30.0–36.0)
MCV: 81.5 fL (ref 78.0–100.0)
MONO ABS: 2 10*3/uL — AB (ref 0.1–1.0)
Monocytes Relative: 19 %
Neutro Abs: 6.7 10*3/uL (ref 1.7–7.7)
Neutrophils Relative %: 63 %
PLATELETS: 363 10*3/uL (ref 150–400)
RBC: 2.54 MIL/uL — ABNORMAL LOW (ref 3.87–5.11)
RDW: 19.2 % — AB (ref 11.5–15.5)
WBC: 10.5 10*3/uL (ref 4.0–10.5)

## 2016-03-19 LAB — TYPE AND SCREEN
ABO/RH(D): O POS
Antibody Screen: NEGATIVE
UNIT DIVISION: 0
Unit division: 0

## 2016-03-19 LAB — RETICULOCYTES: RBC.: 2.54 MIL/uL — AB (ref 3.87–5.11)

## 2016-03-19 MED ORDER — FUROSEMIDE 20 MG PO TABS
20.0000 mg | ORAL_TABLET | Freq: Once | ORAL | Status: AC
  Administered 2016-03-19: 20 mg via ORAL
  Filled 2016-03-19: qty 1

## 2016-03-19 NOTE — Discharge Summary (Signed)
Brittney Rocha MRN: [id number] DOB/AGE: 1986-03-11 30 y.o.  Admit date: 03/15/2016 Discharge date: 03/19/2016  Primary Care Physician:  Legrand Como L. Lavonna Rua, MD   Discharge Diagnoses:   Patient Active Problem List   Diagnosis Date Noted  . Sickle cell pain crisis ([place]) 03/15/2016  . Leukocytosis 03/15/2016  . Thrombocytosis ([place]) 03/15/2016  . Chest pain 03/15/2016  . Anemia of chronic disease     DISCHARGE MEDICATION:   Medication List    TAKE these medications        folic acid 1 MG tablet  Commonly known as:  FOLVITE  Take 1 mg by mouth daily.     hydroxyurea 500 MG capsule  Commonly known as:  HYDREA  Take 1,500 mg by mouth daily. May take with food to minimize GI side effects.     Oxycodone HCl 10 MG Tabs  Take 10 mg by mouth every 4 (four) hours as needed. For pain.     OXYCONTIN 10 mg 12 hr tablet  Generic drug:  oxyCODONE  Take 10 mg by mouth 2 (two) times daily.          Consults:     SIGNIFICANT DIAGNOSTIC STUDIES:  Dg Chest 2 View  03/18/2016  CLINICAL DATA:  Shortness of breath.  Hypoxia.  Sickle cell crisis. EXAM: CHEST  2 VIEW COMPARISON:  03/15/2016 FINDINGS: Bibasilar airspace opacities are noted. No definite effusions. Heart is borderline in size. No acute bony abnormality. IMPRESSION: Bibasilar atelectasis or infiltrates. Electronically Signed   By: Rolm Baptise M.D.   On: 03/18/2016 11:27   Dg Chest 2 View  03/15/2016  CLINICAL DATA:  Chest tightness and dyspnea, onset at rest 4 hours ago. EXAM: CHEST  2 VIEW COMPARISON:  03/08/2016. FINDINGS: Mildly prominent cardiac silhouette. The lungs are clear. No pleural effusions. Unremarkable hilar and mediastinal contours. IMPRESSION: Mildly enlarged cardiac silhouette. No acute cardiopulmonary findings. Electronically Signed   By: Andreas Newport M.D.   On: 03/15/2016 01:22   Dg Chest 2 View  03/08/2016  CLINICAL DATA:  Sickle cell crisis EXAM: CHEST  2 VIEW COMPARISON:  None. FINDINGS: Normal heart size  and mediastinal contours. No acute infiltrate or edema. No effusion or pneumothorax. Cholecystectomy clips. No acute osseous finding. IMPRESSION: Negative chest. Electronically Signed   By: Monte Fantasia M.D.   On: 03/08/2016 06:27      No results found for this or any previous visit (from the past 240 hour(s)).  BRIEF ADMITTING H & P: Brittney Rocha is a 30 y.o. female with a history of Sickle Cell Disease who presents to the ED with complaints of pain in her right elbow, both knee, and in her chest and ribs x 4 hours. The pain is 10/10. She denies any fevers or chills and denies any SOB or nausea or vomiting. A chest X-ray was a performed in the Ed and was negative for infiltrates. She was medicated in the ED for pain and had minimal relief. She was referred for admission. Of Note: She receives her care in [place].   Hospital Course:  Present on Admission:  . Sickle cell pain crisis ([place]): Was admitted with acute sickle cell crisis and required IV Dilaudid and Toradol in addition to her OxyContin which she usually takes. Her pain was localized mostly to her arm and chest wall. She also had an acute hemolytic anemia as noted by elevated LDH and decrease in levels of hemoglobin. For this she received a transfusion of 2 units packed red blood  cells and at the time of discharge her hemoglobin was up to 7.1. She did have an episode of severe pain in the bilateral ribs however this subsided when she had an expulsion of gas both from above and from below. As her pain subsided she was transitioned to oral analgesics at the time of discharge her pain is down to 4-5/10 compared to her baseline pain of 4/10. During her hospitalization she did become hypoxemic and there was some concern for primary pulmonary process. She had a chest x-ray which showed bilateral atelectasis versus infiltrate however she had no clinical sequela that was consistent with an infection. She was encouraged using  incentive spirometer. It was also felt that her hypoxemia was in part due to her low hemoglobin levels. With replacement of her hemoglobin levels and increased use of her symptoms spirometer her oxygen levels improved and at the time of discharge the patient was 94-96 since ambulating on room air. During her hospitalization she was offered Lovenox for DVT prophylaxis touch refused that and opted to use SCDs instead. The patient is discharged in good condition. She does live in [place] which is 3 hour car ride from this institution. She is encouraged to make frequent stops on her ride home, hydrate herself well and utilize her oral analgesia combined with rest in order to recover from the acute crisis. The patient is under the care of Dr. Gita Kudo [facility] hematologist [facility] and she's advised to make an appointment to see him as soon as possible to evaluate her hemoglobin as well as her oxygenation. At the time of discharge she is resumed on her hydroxyurea which was held temporarily while hospitalized due to a low hemoglobin. Her white cell count is normal at 10.5 and platelets are 360 3K. She demonstrates a robust reticulocytosis of greater than 23% at the time of discharge.   The patient during this hospitalization also expressed significant psychological pain surrounded in the very tragic death of her mother in the involvement of her sister. She refused counseling with chaplain but did indicate that she is ready to try to move forward in her life and consider hopeful activities in the future including going back to school. I've advised her to speak with social worker and her hematology clinic and seek help in achieving those goals.   Disposition and Follow-up:  The patient is discharged home in stable condition and her condition is good at time of discharge. She is ambulatory without any need for oxygen supplementation or any assistance.       Discharge Instructions    Activity as tolerated - No restrictions    Complete by:  As directed      Diet general    Complete by:  As directed            DISCHARGE EXAM:  General: Alert, awake, oriented x3, in no apparent distress.  Vital signs: BP113/77, pulse 87, temperature 98.1 F (36.7 C), temperature source Oral, RR 18, height 5\' 4"  (1.626 m), weight 123 lb (55.792 kg), last menstrual period 03/10/2016, SpO2 96 % with ambulation on RA. HEENT: [place]/AT PEERL, EOMI, mild icterus. Neck: Trachea midline, no masses, no thyromegal,y no JVD, no carotid bruit OROPHARYNX: Moist, No exudate/ erythema/lesions.  Heart: Regular rate and rhythm, without murmurs, rubs, gallops or S3. PMI non-displaced. Exam reveals no decreased pulses. Pulmonary/Chest: Normal effort. Breath sounds normal. No. Apnea. Clear to auscultation,no stridor,  no wheezing and no  rhonchi noted. No respiratory distress and no tenderness noted. Abdomen: Soft, nontender, nondistended, normal bowel sounds, no masses no hepatosplenomegaly noted. No fluid wave and no ascites. There is no guarding or rebound. Neuro: Alert and oriented to person, place and time. Normal motor skills, Displays no atrophy or tremors and exhibits normal muscle tone.  No focal neurological deficits noted cranial nerves II through XII grossly intact. No sensory deficit noted.  Strength at baseline in bilateral upper and lower extremities. Gait normal. Musculoskeletal: No warm swelling or erythema around joints, no spinal tenderness noted. Psychiatric: Patient alert and oriented x3, good insight and cognition, good recent to remote recall. Mood, memory, affect and judgement normal Lymph node survey: No cervical axillary or inguinal lymphadenopathy noted. Skin: Skin is warm and dry. No bruising, no ecchymosis and no rash noted. Pt is not diaphoretic. No erythema. No pallor    Recent Labs  03/17/16 0447 03/19/16 0336  NA 134* 137  K 4.5 4.4  CL 101  106  CO2 24 22  GLUCOSE 97 114*  BUN 10 8  CREATININE 0.36* <0.30*  CALCIUM 8.8* 8.8*   No results for input(s): AST, ALT, ALKPHOS, BILITOT, PROT, ALBUMIN in the last 72 hours. No results for input(s): LIPASE, AMYLASE in the last 72 hours.  Recent Labs  03/18/16 0447 03/19/16 0336  WBC 16.7* 10.5  NEUTROABS 11.0* 6.7  HGB 5.3* 7.1*  HCT 15.4* 20.7*  MCV 81.5 81.5  PLT 301 363     Total time spent including face to face and decision making was greater than 30 minutes  Signed: MATTHEWS,MICHELLE A. 03/19/2016, 10:47 AM

## 2016-03-19 NOTE — Progress Notes (Signed)
Pt discharged in stable condition. Discharge instructions given. Pt verbalized understanding 

## 2016-03-20 ENCOUNTER — Telehealth (HOSPITAL_COMMUNITY): Payer: Self-pay | Admitting: *Deleted

## 2016-03-20 NOTE — Telephone Encounter (Signed)
Patient received called requesting to be [facility] for c/o chest pain and sob. Pt advised to go to the emergency room, stated she had just gotten out of the hospital. Checked with provider Thailand Hollis, NP and was told that patient should be seen in the emergency room. Pt was told that she could not be seen over first with chest pain. Pt voiced understanding.

## 2016-05-21 ENCOUNTER — Encounter (HOSPITAL_COMMUNITY): Payer: Self-pay | Admitting: Emergency Medicine

## 2016-05-21 ENCOUNTER — Emergency Department (HOSPITAL_COMMUNITY)
Admission: EM | Admit: 2016-05-21 | Discharge: 2016-05-21 | Disposition: A | Discharge status: Home / Self Care | Payer: Medicaid Other | Attending: Emergency Medicine | Admitting: Emergency Medicine

## 2016-05-21 DIAGNOSIS — D5700 Hb-SS disease with crisis, unspecified: Secondary | ICD-10-CM

## 2016-05-21 DIAGNOSIS — Z79899 Other long term (current) drug therapy: Secondary | ICD-10-CM | POA: Insufficient documentation

## 2016-05-21 DIAGNOSIS — M545 Low back pain: Secondary | ICD-10-CM | POA: Diagnosis present

## 2016-05-21 DIAGNOSIS — D57219 Sickle-cell/Hb-C disease with crisis, unspecified: Secondary | ICD-10-CM | POA: Diagnosis not present

## 2016-05-21 LAB — COMPREHENSIVE METABOLIC PANEL
ALBUMIN: 4.5 g/dL (ref 3.5–5.0)
ALT: 14 U/L (ref 14–54)
AST: 26 U/L (ref 15–41)
Alkaline Phosphatase: 96 U/L (ref 38–126)
Anion gap: 8 (ref 5–15)
BUN: 6 mg/dL (ref 6–20)
CHLORIDE: 108 mmol/L (ref 101–111)
CO2: 23 mmol/L (ref 22–32)
Calcium: 9.1 mg/dL (ref 8.9–10.3)
Creatinine, Ser: 0.35 mg/dL — ABNORMAL LOW (ref 0.44–1.00)
GFR calc Af Amer: >60 mL/min (ref >60)
GFR calc non Af Amer: >60 mL/min (ref >60)
GLUCOSE: 100 mg/dL — AB (ref 65–99)
POTASSIUM: 4 mmol/L (ref 3.5–5.1)
Sodium: 139 mmol/L (ref 135–145)
Total Bilirubin: 3.2 mg/dL — ABNORMAL HIGH (ref 0.3–1.2)
Total Protein: 7.5 g/dL (ref 6.5–8.1)

## 2016-05-21 LAB — CBC WITH DIFFERENTIAL/PLATELET
Basophils Absolute: 0 10*3/uL (ref 0.0–0.1)
Basophils Relative: 0 %
EOS PCT: 1 %
Eosinophils Absolute: 0.1 10*3/uL (ref 0.0–0.7)
HEMATOCRIT: 26 % — AB (ref 36.0–46.0)
Hemoglobin: 8.3 g/dL — ABNORMAL LOW (ref 12.0–15.0)
LYMPHS ABS: 4.1 10*3/uL — AB (ref 0.7–4.0)
Lymphocytes Relative: 28 %
MCH: 27.9 pg (ref 26.0–34.0)
MCHC: 31.9 g/dL (ref 30.0–36.0)
MCV: 87.5 fL (ref 78.0–100.0)
MONOS PCT: 9 %
Monocytes Absolute: 1.3 10*3/uL — ABNORMAL HIGH (ref 0.1–1.0)
NEUTROS ABS: 9.2 10*3/uL — AB (ref 1.7–7.7)
Neutrophils Relative %: 62 %
Platelets: 622 10*3/uL — ABNORMAL HIGH (ref 150–400)
RBC: 2.97 MIL/uL — ABNORMAL LOW (ref 3.87–5.11)
RDW: 22.5 % — AB (ref 11.5–15.5)
WBC: 14.7 10*3/uL — ABNORMAL HIGH (ref 4.0–10.5)

## 2016-05-21 LAB — RETICULOCYTES
RBC.: 2.9 MIL/uL — ABNORMAL LOW (ref 3.87–5.11)
RETIC COUNT ABSOLUTE: 565.5 10*3/uL — AB (ref 19.0–186.0)
Retic Ct Pct: 19.5 % — ABNORMAL HIGH (ref 0.4–3.1)

## 2016-05-21 LAB — PREGNANCY, URINE: Preg Test, Ur: NEGATIVE

## 2016-05-21 MED ORDER — HYDROMORPHONE HCL 2 MG/ML IJ SOLN
2.0000 mg | Freq: Once | INTRAMUSCULAR | Status: AC
Start: 2016-05-21 — End: 2016-05-21
  Administered 2016-05-21: 2 mg via INTRAVENOUS
  Filled 2016-05-21: qty 1

## 2016-05-21 MED ORDER — KETOROLAC TROMETHAMINE 30 MG/ML IJ SOLN
15.0000 mg | Freq: Once | INTRAMUSCULAR | Status: AC
  Administered 2016-05-21: 15 mg via INTRAVENOUS
  Filled 2016-05-21: qty 1

## 2016-05-21 MED ORDER — HYDROMORPHONE HCL 1 MG/ML IJ SOLN
0.5000 mg | Freq: Once | INTRAMUSCULAR | Status: AC
  Administered 2016-05-21: 0.5 mg via INTRAVENOUS
  Filled 2016-05-21: qty 1

## 2016-05-21 MED ORDER — HYDROMORPHONE HCL 2 MG/ML IJ SOLN
2.0000 mg | Freq: Once | INTRAMUSCULAR | Status: AC
  Administered 2016-05-21: 2 mg via INTRAVENOUS
  Filled 2016-05-21: qty 1

## 2016-05-21 MED ORDER — SODIUM CHLORIDE 0.45 % IV SOLN
INTRAVENOUS | Status: DC
  Administered 2016-05-21: 15:00:00 via INTRAVENOUS

## 2016-05-21 MED ORDER — DIPHENHYDRAMINE HCL 50 MG/ML IJ SOLN
25.0000 mg | Freq: Once | INTRAMUSCULAR | Status: AC
  Administered 2016-05-21: 25 mg via INTRAVENOUS
  Filled 2016-05-21: qty 1

## 2016-05-21 NOTE — ED Notes (Signed)
Pt trying to void

## 2016-05-21 NOTE — ED Notes (Signed)
Discharged instructions reviewed with patient. Also gave information regarding sickle cell clinic and advised to locate a primary care provider in the area as she has recently relocated to [facility].

## 2016-05-21 NOTE — Discharge Instructions (Signed)
Sickle Cell Anemia, Adult Sickle cell anemia is a condition in which red blood cells have an abnormal "sickle" shape. This abnormal shape shortens the cells' life span, which results in a lower than normal concentration of red blood cells in the blood. The sickle shape also causes the cells to clump together and block free blood flow through the blood vessels. As a result, the tissues and organs of the body do not receive enough oxygen. Sickle cell anemia causes organ damage and pain and increases the risk of infection. CAUSES  Sickle cell anemia is a genetic disorder. Those who receive two copies of the gene have the condition, and those who receive one copy have the trait. RISK FACTORS The sickle cell gene is most common in people whose families originated in [place]. Other areas of the globe where sickle cell trait occurs include the Mediterranean, [place] and [place], the [place], and the [place].  SIGNS AND SYMPTOMS  Pain, especially in the extremities, back, chest, or abdomen (common). The pain may start suddenly or may develop following an illness, especially if there is dehydration. Pain can also occur due to overexertion or exposure to extreme temperature changes.  Frequent severe bacterial infections, especially certain types of pneumonia and meningitis.  Pain and swelling in the hands and feet.  Decreased activity.   Loss of appetite.   Change in behavior.  Headaches.  Seizures.  Shortness of breath or difficulty breathing.  Vision changes.  Skin ulcers. Those with the trait may not have symptoms or they may have mild symptoms.  DIAGNOSIS  Sickle cell anemia is diagnosed with blood tests that demonstrate the genetic trait. It is often diagnosed during the newborn period, due to mandatory testing nationwide. A variety of blood tests, X-rays, CT scans, MRI scans, ultrasounds, and lung function tests may also be done to monitor the condition. TREATMENT  Sickle  cell anemia may be treated with:  Medicines. You may be given pain medicines, antibiotic medicines (to treat and prevent infections) or medicines to increase the production of certain types of hemoglobin.  Fluids.  Oxygen.  Blood transfusions. HOME CARE INSTRUCTIONS   Drink enough fluid to keep your urine clear or pale yellow. Increase your fluid intake in hot weather and during exercise.  Do not smoke. Smoking lowers oxygen levels in the blood.   Only take over-the-counter or prescription medicines for pain, fever, or discomfort as directed by your health care provider.  Take antibiotics as directed by your health care provider. Make sure you finish them it even if you start to feel better.   Take supplements as directed by your health care provider.   Consider wearing a medical alert bracelet. This tells anyone caring for you in an emergency of your condition.   When traveling, keep your medical information, health care provider's names, and the medicines you take with you at all times.   If you develop a fever, do not take medicines to reduce the fever right away. This could cover up a problem that is developing. Notify your health care provider.  Keep all follow-up appointments with your health care provider. Sickle cell anemia requires regular medical care. SEEK MEDICAL CARE IF: You have a fever. SEEK IMMEDIATE MEDICAL CARE IF:   You feel dizzy or faint.   You have new abdominal pain, especially on the left side near the stomach area.   You develop a persistent, often uncomfortable and painful penile erection (priapism). If this is not treated immediately it   will lead to impotence.   You have numbness your arms or legs or you have a hard time moving them.   You have a hard time with speech.   You have a fever or persistent symptoms for more than 2-3 days.   You have a fever and your symptoms suddenly get worse.   You have signs or symptoms of infection.  These include:   Chills.   Abnormal tiredness (lethargy).   Irritability.   Poor eating.   Vomiting.   You develop pain that is not helped with medicine.   You develop shortness of breath.  You have pain in your chest.   You are coughing up pus-like or bloody sputum.   You develop a stiff neck.  Your feet or hands swell or have pain.  Your abdomen appears bloated.  You develop joint pain. MAKE SURE YOU:  Understand these instructions.   This information is not intended to replace advice given to you by your health care provider. Make sure you discuss any questions you have with your health care provider.   Document Released: 02/04/2006 Document Revised: 11/17/2014 Document Reviewed: 06/08/2013 Elsevier Interactive Patient Education 2016 [employer] Inc[employer]  

## 2016-05-21 NOTE — ED Notes (Signed)
Pt wants to wait and draw labs when RN starts IV

## 2016-05-21 NOTE — Progress Notes (Addendum)
Medicaid [place] access response hx indicates the assigned pcp is [facility] DEPT [address] [place], [place] [address] [phone]  Entered in d/c instructions [place] This is your assigned Medicaid [place] access doctor If you prefer to see another Medicaid doctor other than the one on your Medicaid card [place] [phone] or [phone] Medicaid [place] access response hx indicates the assigned pcp is [facility] DEPT [address] Cathie Beams [place], [place] [address] [phone] Medicaid [place] Access Covered Patient [place]: [place], [place] [address] [email or web address] Use this website to assist with understanding your coverage & to renew application As a Medicaid client you MUST contact DSS/SSI each time you change address, move to another [place] or another state to keep your address updated  [place] Medicaid Transportation to Dr appts if you are have full Medicaid: [phone], [phone]

## 2016-05-21 NOTE — ED Notes (Signed)
Pt reports sickle cell pain generalized in nature since last night. Denies chest pain or have taken any medications. NAD at triage.

## 2016-05-22 NOTE — ED Provider Notes (Signed)
CSN: [id number]     Arrival date & time 05/21/16  1304 History   First MD Initiated Contact with Patient 05/21/16 1348     Chief Complaint  Patient presents with  . Sickle Cell Pain Crisis     (Consider location/radiation/quality/duration/timing/severity/associated sxs/prior Treatment) HPI Comments: Pt comes in with cc of sickle cell anemia pain. Pt reports having pain in her hips, back and legs. Pain is typical of her sickle cell. She is out of her pain meds. She isnt sure what the trigger is. Pt denies nausea, emesis, fevers, chills, chest pains, shortness of breath, headaches, abdominal pain, uti like symptoms. Pt feels that if she got some pain meds through the iv, she might be able to go home. She has no sickle cell doctor currently - she is moving to [place] and her doctors are in [place].   ROS 10 Systems reviewed and are negative for acute change except as noted in the HPI.       Patient is a 30 y.o. female presenting with sickle cell pain. The history is provided by the patient.  Sickle Cell Pain Crisis   Past Medical History  Diagnosis Date  . Sickle cell anemia (HCC)    History reviewed. No pertinent past surgical history. No family history on file. Social History  Substance Use Topics  . Smoking status: Never Smoker   . Smokeless tobacco: None  . Alcohol Use: No   OB History    No data available     Review of Systems    Allergies  Review of patient's allergies indicates no known allergies.  Home Medications   Prior to Admission medications   Medication Sig Start Date End Date Taking? Authorizing Provider  folic acid (FOLVITE) 1 MG tablet Take 1 mg by mouth daily.   Yes Historical Provider, MD  hydroxyurea (HYDREA) 500 MG capsule Take 1,500 mg by mouth daily. May take with food to minimize GI side effects.   Yes Historical Provider, MD   BP 118/64 mmHg  Pulse 72  Temp(Src) 98.4 F (36.9 C) (Oral)  Resp 16  Ht 5\' 4"  (1.626 m)  Wt 130 lb (58.968  kg)  BMI 22.30 kg/m2  SpO2 100%  LMP 05/21/2016 (Exact Date) Physical Exam  Constitutional: She is oriented to person, place, and time. She appears well-developed.  HENT:  Head: Normocephalic and atraumatic.  Eyes: EOM are normal.  Neck: Normal range of motion. Neck supple.  Cardiovascular: Normal rate.   Pulmonary/Chest: Effort normal.  Abdominal: Bowel sounds are normal.  Neurological: She is alert and oriented to person, place, and time.  Skin: Skin is warm and dry.  Nursing note and vitals reviewed.   ED Course  Procedures (including critical care time) Labs Review Labs Reviewed  COMPREHENSIVE METABOLIC PANEL - Abnormal; Notable for the following:    Glucose, Bld 100 (*)    Creatinine, Ser 0.35 (*)    Total Bilirubin 3.2 (*)    All other components within normal limits  CBC WITH DIFFERENTIAL/PLATELET - Abnormal; Notable for the following:    WBC 14.7 (*)    RBC 2.97 (*)    Hemoglobin 8.3 (*)    HCT 26.0 (*)    RDW 22.5 (*)    Platelets 622 (*)    Neutro Abs 9.2 (*)    Lymphs Abs 4.1 (*)    Monocytes Absolute 1.3 (*)    All other components within normal limits  RETICULOCYTES - Abnormal; Notable for the following:  Retic Ct Pct 19.5 (*)    RBC. 2.90 (*)    Retic Count, Manual 565.5 (*)    All other components within normal limits  PREGNANCY, URINE    Imaging Review No results found. I have personally reviewed and evaluated these images and lab results as part of my medical decision-making.   EKG Interpretation None      MDM   Final diagnoses:  Sickle cell pain crisis ([place])    Pt comes in with sickle cell pain. She received ib hydration and iv pain meds. Upon reassessment after 5 pm, pt reports feeling better. Still in mild discomfort, but improved and comfortable going home. Strict ER return precautions have been discussed, and patient is agreeing with the plan and is comfortable with the workup done and the recommendations from the  ER.     Varney Biles, MD 05/22/16 1545

## 2016-05-23 ENCOUNTER — Emergency Department (HOSPITAL_COMMUNITY)
Admission: EM | Admit: 2016-05-23 | Discharge: 2016-05-23 | Disposition: A | Discharge status: Home / Self Care | Payer: Medicaid Other | Attending: Emergency Medicine | Admitting: Emergency Medicine

## 2016-05-23 ENCOUNTER — Encounter (HOSPITAL_COMMUNITY): Payer: Self-pay | Admitting: Emergency Medicine

## 2016-05-23 DIAGNOSIS — Z79899 Other long term (current) drug therapy: Secondary | ICD-10-CM | POA: Insufficient documentation

## 2016-05-23 DIAGNOSIS — D5700 Hb-SS disease with crisis, unspecified: Secondary | ICD-10-CM | POA: Diagnosis present

## 2016-05-23 LAB — CBC WITH DIFFERENTIAL/PLATELET
Basophils Absolute: 0 10*3/uL (ref 0.0–0.1)
Basophils Relative: 0 %
Eosinophils Absolute: 0.2 10*3/uL (ref 0.0–0.7)
Eosinophils Relative: 1 %
HCT: 26.6 % — ABNORMAL LOW (ref 36.0–46.0)
Hemoglobin: 8.9 g/dL — ABNORMAL LOW (ref 12.0–15.0)
LYMPHS PCT: 34 %
Lymphs Abs: 5.3 10*3/uL — ABNORMAL HIGH (ref 0.7–4.0)
MCH: 28.8 pg (ref 26.0–34.0)
MCHC: 33.5 g/dL (ref 30.0–36.0)
MCV: 86.1 fL (ref 78.0–100.0)
MONO ABS: 1.4 10*3/uL — AB (ref 0.1–1.0)
Monocytes Relative: 9 %
NEUTROS PCT: 56 %
NRBC: 1 /100{WBCs} — AB
Neutro Abs: 8.7 10*3/uL — ABNORMAL HIGH (ref 1.7–7.7)
PLATELETS: 260 10*3/uL (ref 150–400)
RBC: 3.09 MIL/uL — ABNORMAL LOW (ref 3.87–5.11)
RDW: 21.1 % — AB (ref 11.5–15.5)
WBC: 15.6 10*3/uL — ABNORMAL HIGH (ref 4.0–10.5)

## 2016-05-23 LAB — RETICULOCYTES
RBC.: 3.09 MIL/uL — ABNORMAL LOW (ref 3.87–5.11)
RETIC CT PCT: 17.1 % — AB (ref 0.4–3.1)
Retic Count, Absolute: 528.4 10*3/uL — ABNORMAL HIGH (ref 19.0–186.0)

## 2016-05-23 MED ORDER — HYDROMORPHONE HCL 2 MG/ML IJ SOLN: 0.0250 mg/kg | INTRAMUSCULAR | Status: DC

## 2016-05-23 MED ORDER — HYDROMORPHONE HCL 2 MG/ML IJ SOLN
0.0313 mg/kg | INTRAMUSCULAR | Status: AC
  Administered 2016-05-23: 1.8 mg via INTRAVENOUS
  Filled 2016-05-23: qty 1

## 2016-05-23 MED ORDER — SODIUM CHLORIDE 0.45 % IV SOLN
INTRAVENOUS | Status: DC
  Administered 2016-05-23: 05:00:00 via INTRAVENOUS

## 2016-05-23 MED ORDER — HYDROMORPHONE HCL 2 MG/ML IJ SOLN: 0.0313 mg/kg | INTRAMUSCULAR | Status: AC

## 2016-05-23 MED ORDER — DIPHENHYDRAMINE HCL 25 MG PO CAPS
25.0000 mg | ORAL_CAPSULE | ORAL | Status: DC | PRN
  Administered 2016-05-23: 25 mg via ORAL
  Filled 2016-05-23: qty 1

## 2016-05-23 MED ORDER — HYDROMORPHONE HCL 1 MG/ML IJ SOLN
1.0000 mg | Freq: Once | INTRAMUSCULAR | Status: AC
  Administered 2016-05-23: 1 mg via INTRAVENOUS
  Filled 2016-05-23: qty 1

## 2016-05-23 MED ORDER — OXYCODONE HCL 10 MG PO TABS: 10.0000 mg | ORAL_TABLET | Freq: Four times a day (QID) | ORAL | Status: AC | PRN

## 2016-05-23 MED ORDER — KETOROLAC TROMETHAMINE 15 MG/ML IJ SOLN
15.0000 mg | INTRAMUSCULAR | Status: AC
  Administered 2016-05-23: 15 mg via INTRAVENOUS
  Filled 2016-05-23: qty 1

## 2016-05-23 MED ORDER — HYDROMORPHONE HCL 2 MG/ML IJ SOLN
2.0000 mg | Freq: Once | INTRAMUSCULAR | Status: AC
  Administered 2016-05-23: 2 mg via INTRAVENOUS
  Filled 2016-05-23: qty 1

## 2016-05-23 NOTE — ED Provider Notes (Signed)
CSN: [id number]     Arrival date & time 05/23/16  0221 History   First MD Initiated Contact with Patient 05/23/16 0401     Chief Complaint  Patient presents with  . Sickle Cell Pain Crisis     (Consider location/radiation/quality/duration/timing/severity/associated sxs/prior Treatment) HPI Comments: This a 30 year old female with a history of sickle cell disease who was seen in this emergency department yesterday for same states she was not given prescriptions for any pain medication.  She did not take any over-the-counter medication.  Prior to arriving back in the emergency department.  She states she is new to the area does not have a local PCP yet.  She states she does take or hydroxyurea  and folic acid on a regular basis.  Menstrual ycle is on currently.  She states the pain is in her lower knees, which is typical for her.  She denies any shortness of breath, chest discomfort, fever.  She states she is staying hydrated and heat.  Patient is a 30 y.o. female presenting with sickle cell pain. The history is provided by the patient.  Sickle Cell Pain Crisis Location:  Lower extremity Severity:  Severe Onset quality:  Gradual Similar to previous crisis episodes: yes   Timing:  Constant Chronicity:  Recurrent History of pulmonary emboli: no   Relieved by:  None tried Worsened by:  Activity Associated symptoms: no chest pain, no congestion, no cough, no fatigue, no fever, no headaches, no nausea, no sore throat, no swelling of legs and no vomiting   Risk factors: frequent pain crises     Past Medical History  Diagnosis Date  . Sickle cell anemia (HCC)    History reviewed. No pertinent past surgical history. History reviewed. No pertinent family history. Social History  Substance Use Topics  . Smoking status: Never Smoker   . Smokeless tobacco: None  . Alcohol Use: No   OB History    No data available     Review of Systems  Constitutional: Negative for fever and fatigue.   HENT: Negative for congestion and sore throat.   Respiratory: Negative for cough.   Cardiovascular: Negative for chest pain.  Gastrointestinal: Negative for nausea and vomiting.  Genitourinary: Negative for dysuria.  Musculoskeletal: Positive for myalgias.  Skin: Negative for wound.  Neurological: Negative for dizziness and headaches.  All other systems reviewed and are negative.     Allergies  Review of patient's allergies indicates no known allergies.  Home Medications   Prior to Admission medications   Medication Sig Start Date End Date Taking? Authorizing Provider  folic acid (FOLVITE) 1 MG tablet Take 1 mg by mouth daily.   Yes Historical Provider, MD  hydroxyurea (HYDREA) 500 MG capsule Take 1,500 mg by mouth daily. May take with food to minimize GI side effects.   Yes Historical Provider, MD  Oxycodone HCl 10 MG TABS Take 1 tablet (10 mg total) by mouth every 6 (six) hours as needed. 05/23/16   Jeffrey Hedges, PA-C   BP 129/65 mmHg  Pulse 59  Temp(Src) 98.9 F (37.2 C) (Oral)  Resp 18  SpO2 95%  LMP 05/21/2016 (Exact Date) Physical Exam  Constitutional: She is oriented to person, place, and time. She appears well-developed and well-nourished.  HENT:  Head: Normocephalic.  Eyes: Pupils are equal, round, and reactive to light.  Neck: Normal range of motion.  Cardiovascular: Normal rate and regular rhythm.   Pulmonary/Chest: Effort normal and breath sounds normal.  Abdominal: Soft.  Genitourinary: Vagina normal.  Musculoskeletal: Normal range of motion. She exhibits no edema or tenderness.  Neurological: She is alert and oriented to person, place, and time.  Skin: Skin is warm.  Nursing note and vitals reviewed.   ED Course  Procedures (including critical care time) Labs Review Labs Reviewed  CBC WITH DIFFERENTIAL/PLATELET - Abnormal; Notable for the following:    WBC 15.6 (*)    RBC 3.09 (*)    Hemoglobin 8.9 (*)    HCT 26.6 (*)    RDW 21.1 (*)    nRBC 1  (*)    Neutro Abs 8.7 (*)    Lymphs Abs 5.3 (*)    Monocytes Absolute 1.4 (*)    All other components within normal limits  RETICULOCYTES - Abnormal; Notable for the following:    Retic Ct Pct 17.1 (*)    RBC. 3.09 (*)    Retic Count, Manual 528.4 (*)    All other components within normal limits    Imaging Review No results found. I have personally reviewed and evaluated these images and lab results as part of my medical decision-making.   EKG Interpretation None      MDM   Final diagnoses:  Sickle cell anemia with pain ([place])         Junius Creamer, NP 05/23/16 [place], MD 05/29/16 [phone]

## 2016-05-23 NOTE — ED Provider Notes (Signed)
Pt care signed out at shift change by Junius Creamer NP pending reassesment and disposition. Please see previous provider's note for full H&P. 30 year old female with a history of sickle cell, seen yesterday for the same likely vaso-occlusive pain crisis, discharged home. She reports she does not have any medications at home to treat her pain as she is new to the area. Patient reports the pain is in her lower knees, typical of previous sickle cell pain crisis, she denies any chest pain, shortness of breath, fever, swelling or edema.  Patient given 3 doses of Dilaudid here in the ED with improvement in her pain symptoms.  Filed Vitals:   05/23/16 0527 05/23/16 0610  BP: 132/75 153/53  Pulse: 45 67  Temp:    Resp: 12 15    Physical Exam  Constitutional: She appears well-developed and well-nourished. No distress.  HENT:  Head: Normocephalic.  Eyes: Pupils are equal, round, and reactive to light.  Pulmonary/Chest: Effort normal.  Musculoskeletal: Normal range of motion. She exhibits no tenderness.  Skin: Skin is warm.  Psychiatric: She has a normal mood and affect.  Nursing note and vitals reviewed.  Results for orders placed or performed during the hospital encounter of 05/23/16  CBC WITH DIFFERENTIAL  Result Value Ref Range   WBC 15.6 (H) 4.0 - 10.5 K/uL   RBC 3.09 (L) 3.87 - 5.11 MIL/uL   Hemoglobin 8.9 (L) 12.0 - 15.0 g/dL   HCT 26.6 (L) 36.0 - 46.0 %   MCV 86.1 78.0 - 100.0 fL   MCH 28.8 26.0 - 34.0 pg   MCHC 33.5 30.0 - 36.0 g/dL   RDW 21.1 (H) 11.5 - 15.5 %   Platelets 260 150 - 400 K/uL   Neutrophils Relative % 56 %   Lymphocytes Relative 34 %   Monocytes Relative 9 %   Eosinophils Relative 1 %   Basophils Relative 0 %   nRBC 1 (H) 0 /100 WBC   Neutro Abs 8.7 (H) 1.7 - 7.7 K/uL   Lymphs Abs 5.3 (H) 0.7 - 4.0 K/uL   Monocytes Absolute 1.4 (H) 0.1 - 1.0 K/uL   Eosinophils Absolute 0.2 0.0 - 0.7 K/uL   Basophils Absolute 0.0 0.0 - 0.1 K/uL   RBC Morphology POLYCHROMASIA  PRESENT   Reticulocytes  Result Value Ref Range   Retic Ct Pct 17.1 (H) 0.4 - 3.1 %   RBC. 3.09 (L) 3.87 - 5.11 MIL/uL   Retic Count, Manual 528.4 (H) 19.0 - 186.0 K/uL   No results found.    Assessment and Plan : Pt with a history of sickle cell disease presents with likely vasoocclusive pain episode. Today's presentation typical of previous episodes and managed with above hospital administered medications with good symptomatic improvement. Pt has no signs or symptoms that would indicate acutely life threatening or disabling conditions such as infection, severe anemia, stroke, acute chest syndrome, renal infarction, bone infection, dactylitis, MI, priapism, organ failure, or venous thromboembolism. Patient is instructed to follow-up with sickle cell clinic, and is given strict return precautions the event new or worsening signs or symptoms present. Patient verbalizes understanding and agreement for today's plan has no further questions or concerns at the time of discharge.     Okey Regal, PA-C 05/23/16 [place], MD 05/29/16 [phone]

## 2016-05-23 NOTE — Discharge Instructions (Signed)
Please follow-up with the sickle cell clinic for further management of your condition. He didn't experience any new or worsening signs or symptoms please return to the emergency room for further evaluation and management.  Sickle Cell Anemia, Adult Sickle cell anemia is a condition in which red blood cells have an abnormal "sickle" shape. This abnormal shape shortens the cells' life span, which results in a lower than normal concentration of red blood cells in the blood. The sickle shape also causes the cells to clump together and block free blood flow through the blood vessels. As a result, the tissues and organs of the body do not receive enough oxygen. Sickle cell anemia causes organ damage and pain and increases the risk of infection. CAUSES  Sickle cell anemia is a genetic disorder. Those who receive two copies of the gene have the condition, and those who receive one copy have the trait. RISK FACTORS The sickle cell gene is most common in people whose families originated in [place]. Other areas of the globe where sickle cell trait occurs include the [place], [place] and [place], and the [place].  SIGNS AND SYMPTOMS  Pain, especially in the extremities, back, chest, or abdomen (common). The pain may start suddenly or may develop following an illness, especially if there is dehydration. Pain can also occur due to overexertion or exposure to extreme temperature changes.  Frequent severe bacterial infections, especially certain types of pneumonia and meningitis.  Pain and swelling in the hands and feet.  Decreased activity.   Loss of appetite.   Change in behavior.  Headaches.  Seizures.  Shortness of breath or difficulty breathing.  Vision changes.  Skin ulcers. Those with the trait may not have symptoms or they may have mild symptoms.  DIAGNOSIS  Sickle cell anemia is diagnosed with blood tests that demonstrate the genetic trait. It is often  diagnosed during the newborn period, due to mandatory testing nationwide. A variety of blood tests, X-rays, CT scans, MRI scans, ultrasounds, and lung function tests may also be done to monitor the condition. TREATMENT  Sickle cell anemia may be treated with:  Medicines. You may be given pain medicines, antibiotic medicines (to treat and prevent infections) or medicines to increase the production of certain types of hemoglobin.  Fluids.  Oxygen.  Blood transfusions. HOME CARE INSTRUCTIONS   Drink enough fluid to keep your urine clear or pale yellow. Increase your fluid intake in hot weather and during exercise.  Do not smoke. Smoking lowers oxygen levels in the blood.   Only take over-the-counter or prescription medicines for pain, fever, or discomfort as directed by your health care provider.  Take antibiotics as directed by your health care provider. Make sure you finish them it even if you start to feel better.   Take supplements as directed by your health care provider.   Consider wearing a medical alert bracelet. This tells anyone caring for you in an emergency of your condition.   When traveling, keep your medical information, health care provider's names, and the medicines you take with you at all times.   If you develop a fever, do not take medicines to reduce the fever right away. This could cover up a problem that is developing. Notify your health care provider.  Keep all follow-up appointments with your health care provider. Sickle cell anemia requires regular medical care. SEEK MEDICAL CARE IF: You have a fever. SEEK IMMEDIATE MEDICAL CARE IF:   You feel dizzy or faint.  You have new abdominal pain, especially on the left side near the stomach area.   You develop a persistent, often uncomfortable and painful penile erection (priapism). If this is not treated immediately it will lead to impotence.   You have numbness your arms or legs or you have a hard  time moving them.   You have a hard time with speech.   You have a fever or persistent symptoms for more than 2-3 days.   You have a fever and your symptoms suddenly get worse.   You have signs or symptoms of infection. These include:   Chills.   Abnormal tiredness (lethargy).   Irritability.   Poor eating.   Vomiting.   You develop pain that is not helped with medicine.   You develop shortness of breath.  You have pain in your chest.   You are coughing up pus-like or bloody sputum.   You develop a stiff neck.  Your feet or hands swell or have pain.  Your abdomen appears bloated.  You develop joint pain. MAKE SURE YOU:  Understand these instructions.   This information is not intended to replace advice given to you by your health care provider. Make sure you discuss any questions you have with your health care provider.   Document Released: 02/04/2006 Document Revised: 11/17/2014 Document Reviewed: 06/08/2013 Elsevier Interactive Patient Education [employer].

## 2016-05-23 NOTE — ED Notes (Signed)
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

## 2017-05-08 IMAGING — CR DG CHEST 2V
2 series · 2 of 2 positions shown · non-contrast
Comparison: None.

CLINICAL DATA: Sickle cell crisis

EXAM:
CHEST  2 VIEW

[w chest pa]
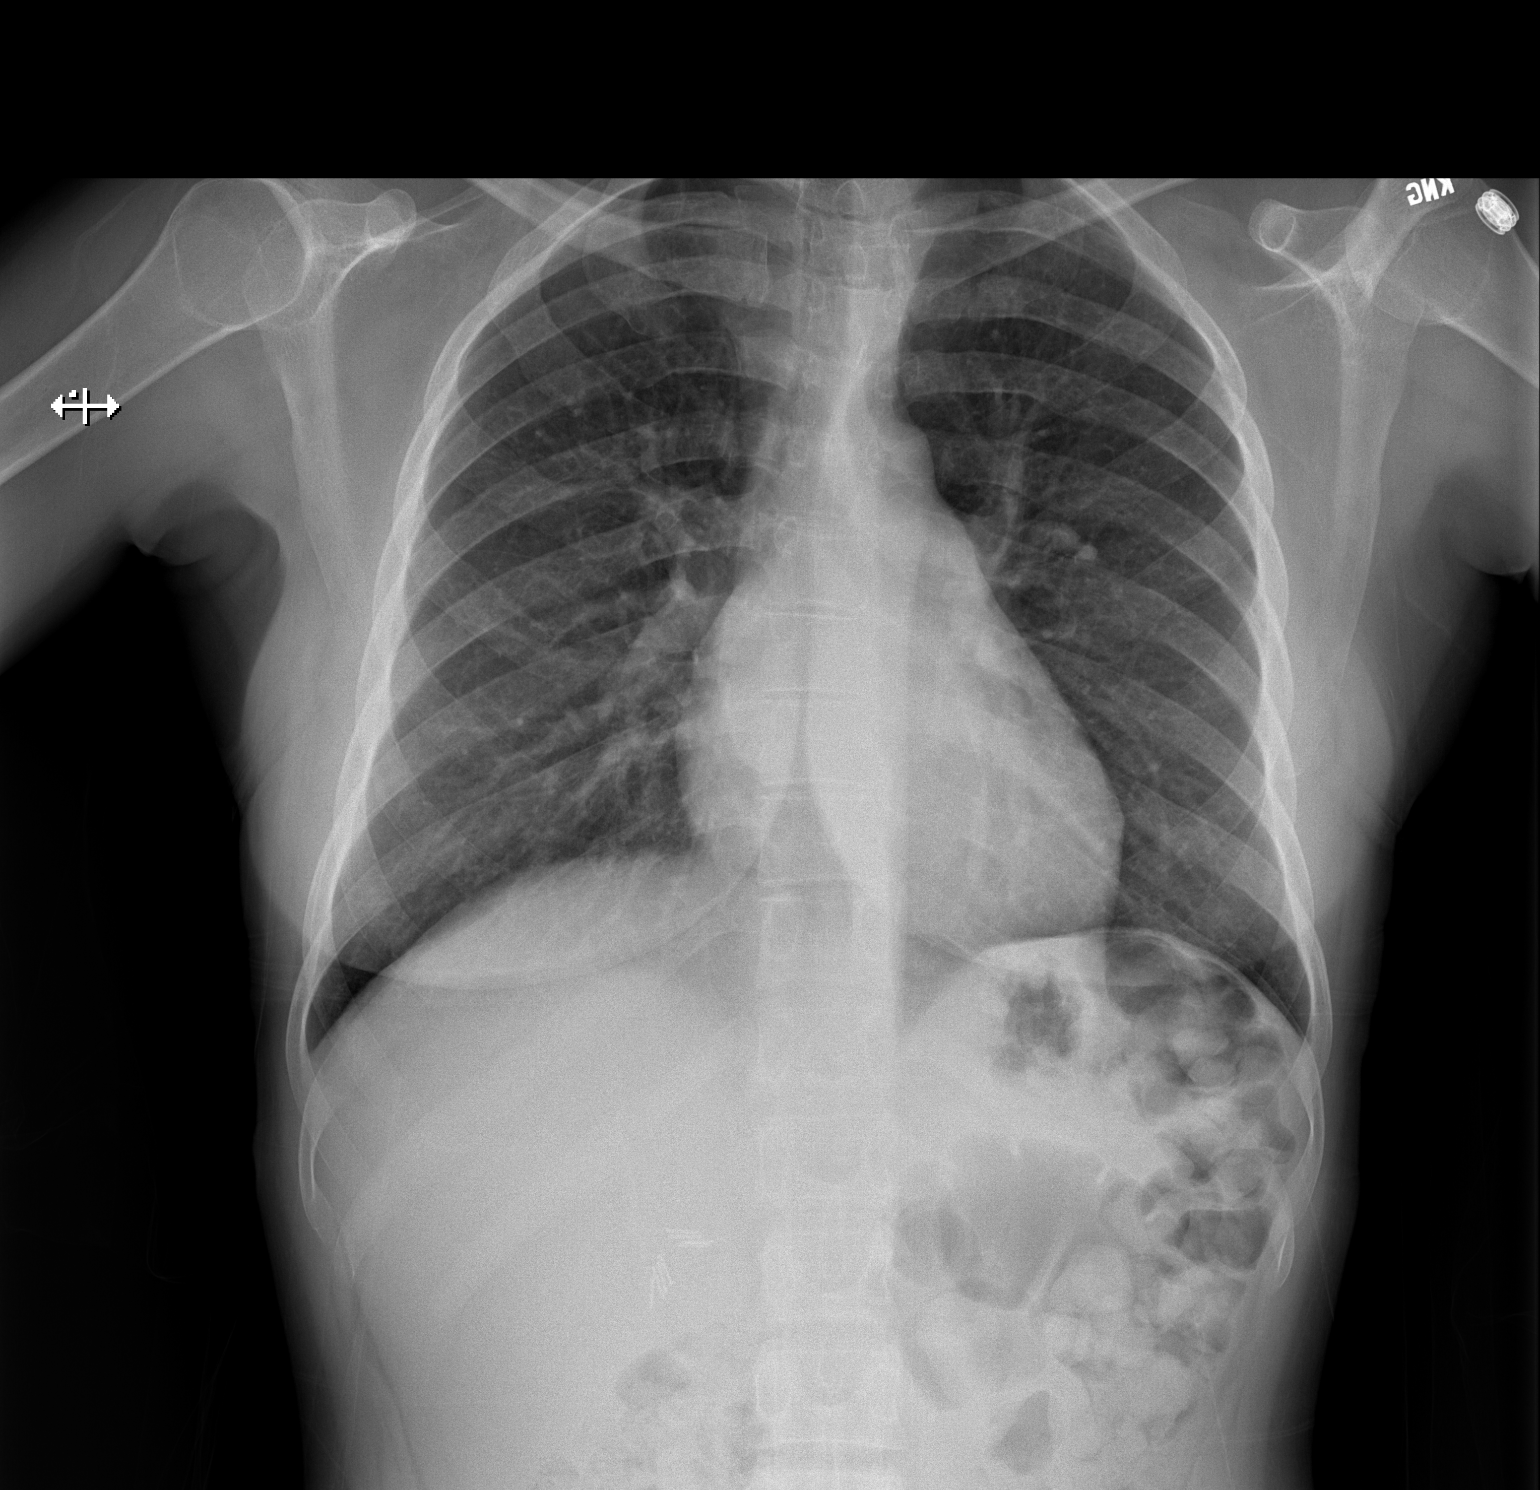

[w chest lat]
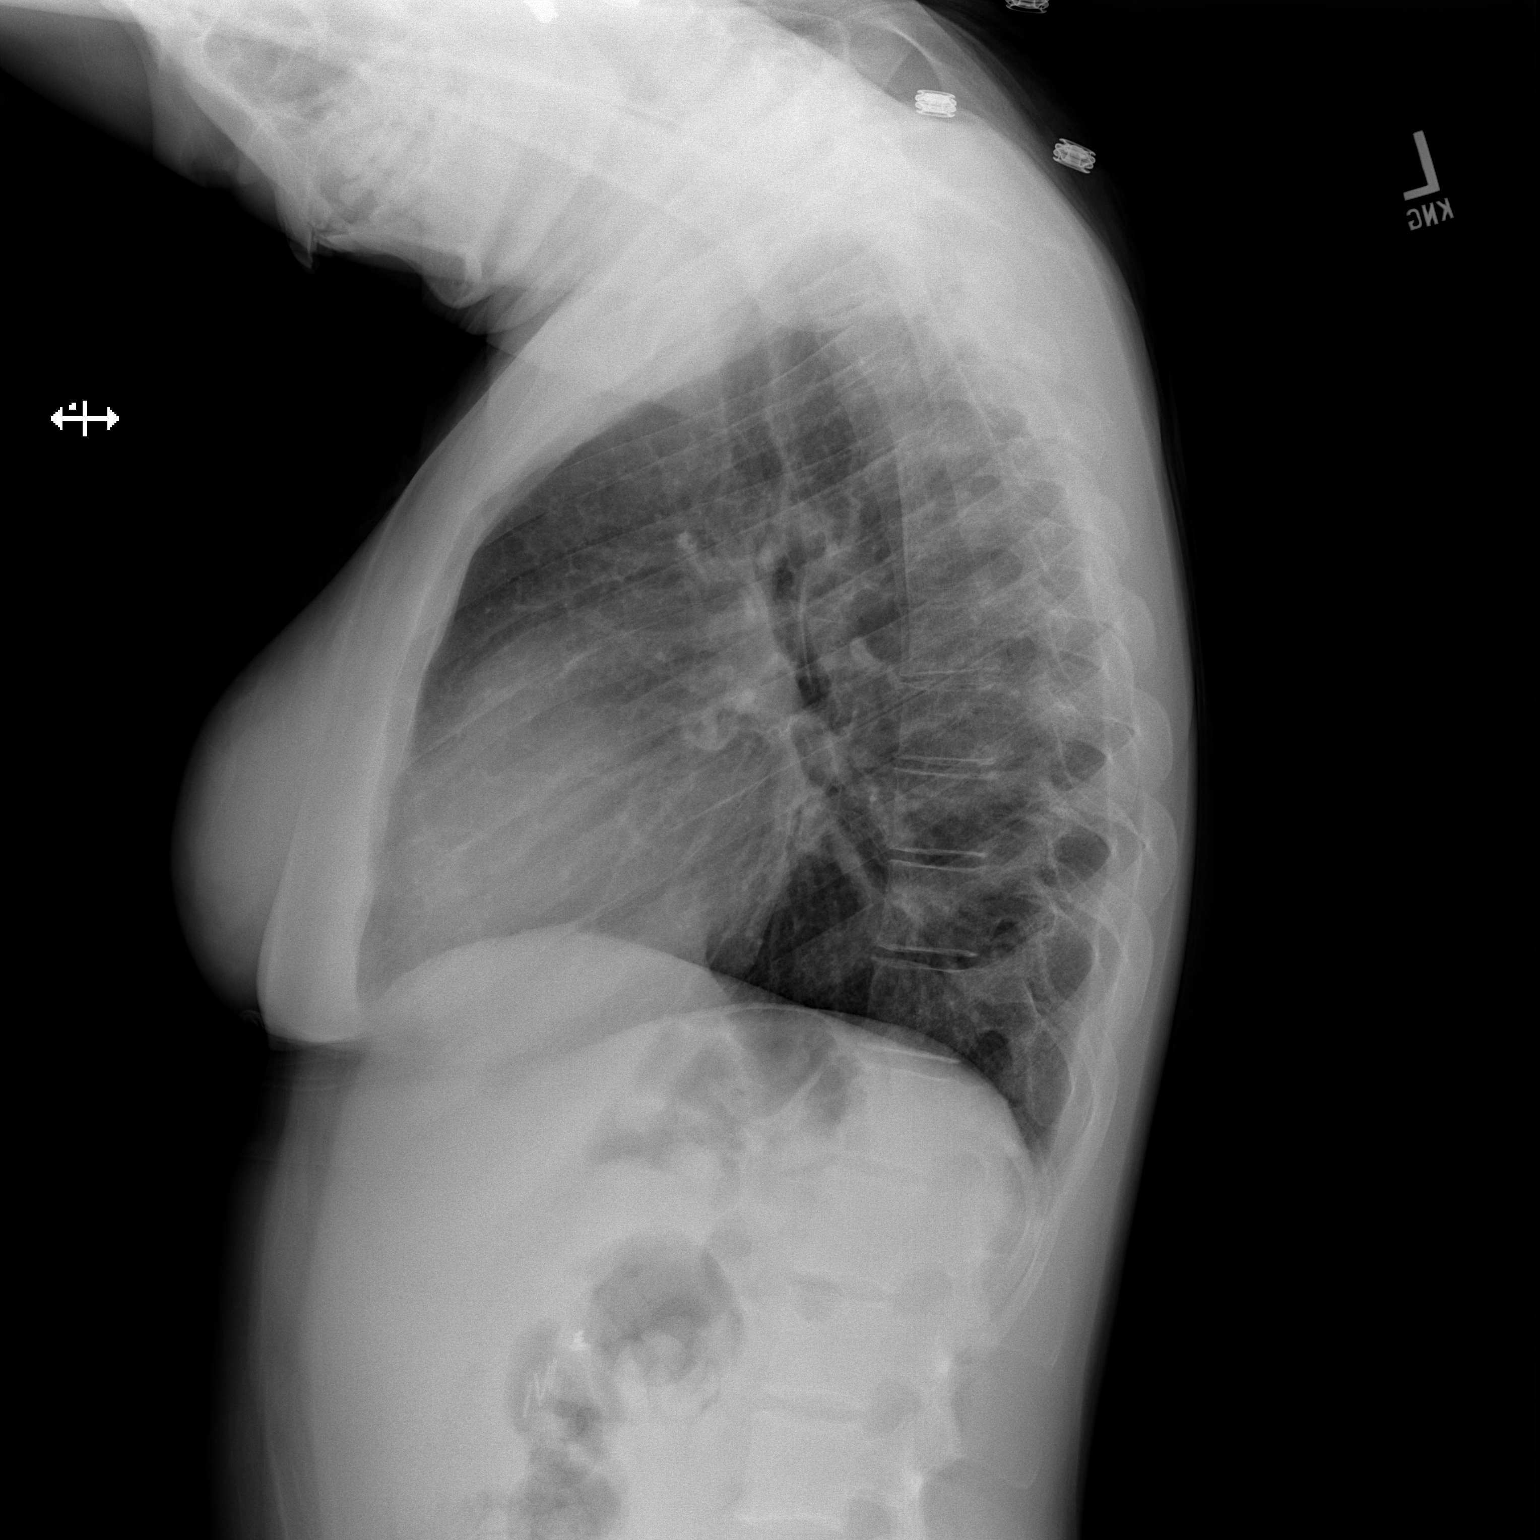

[2 of 2 positions shown; findings below may reference images not displayed]

FINDINGS: Normal heart size and mediastinal contours. No acute infiltrate or
edema. No effusion or pneumothorax. Cholecystectomy clips. No acute
osseous finding.
IMPRESSION: Negative chest.

## 2017-05-15 IMAGING — CR DG CHEST 2V
2 series · 2 of 2 positions shown · non-contrast
Comparison: 03/08/2016.

CLINICAL DATA: Chest tightness and dyspnea, onset at rest 4 hours
ago.

EXAM:
CHEST  2 VIEW

[w chest pa]
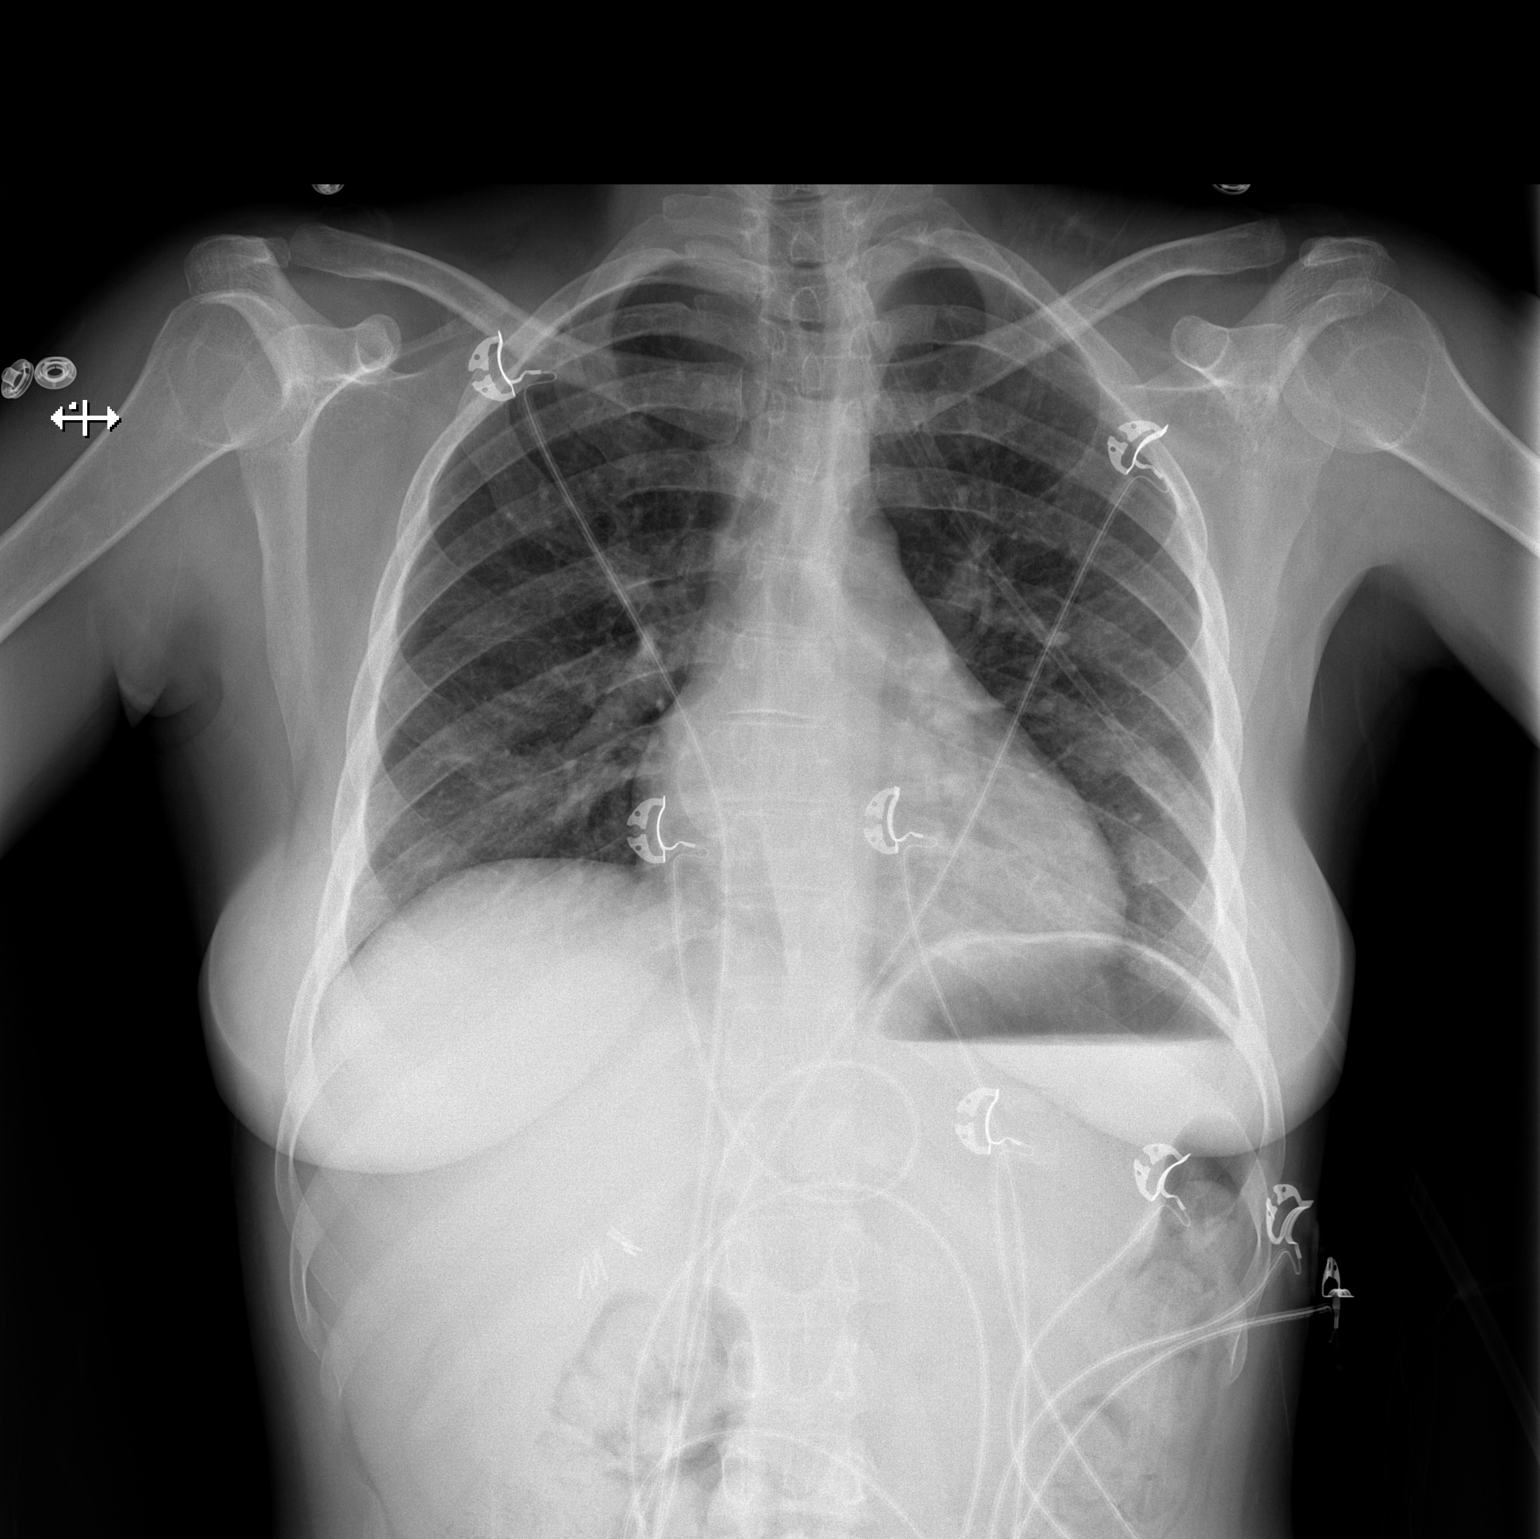

[w chest lat]
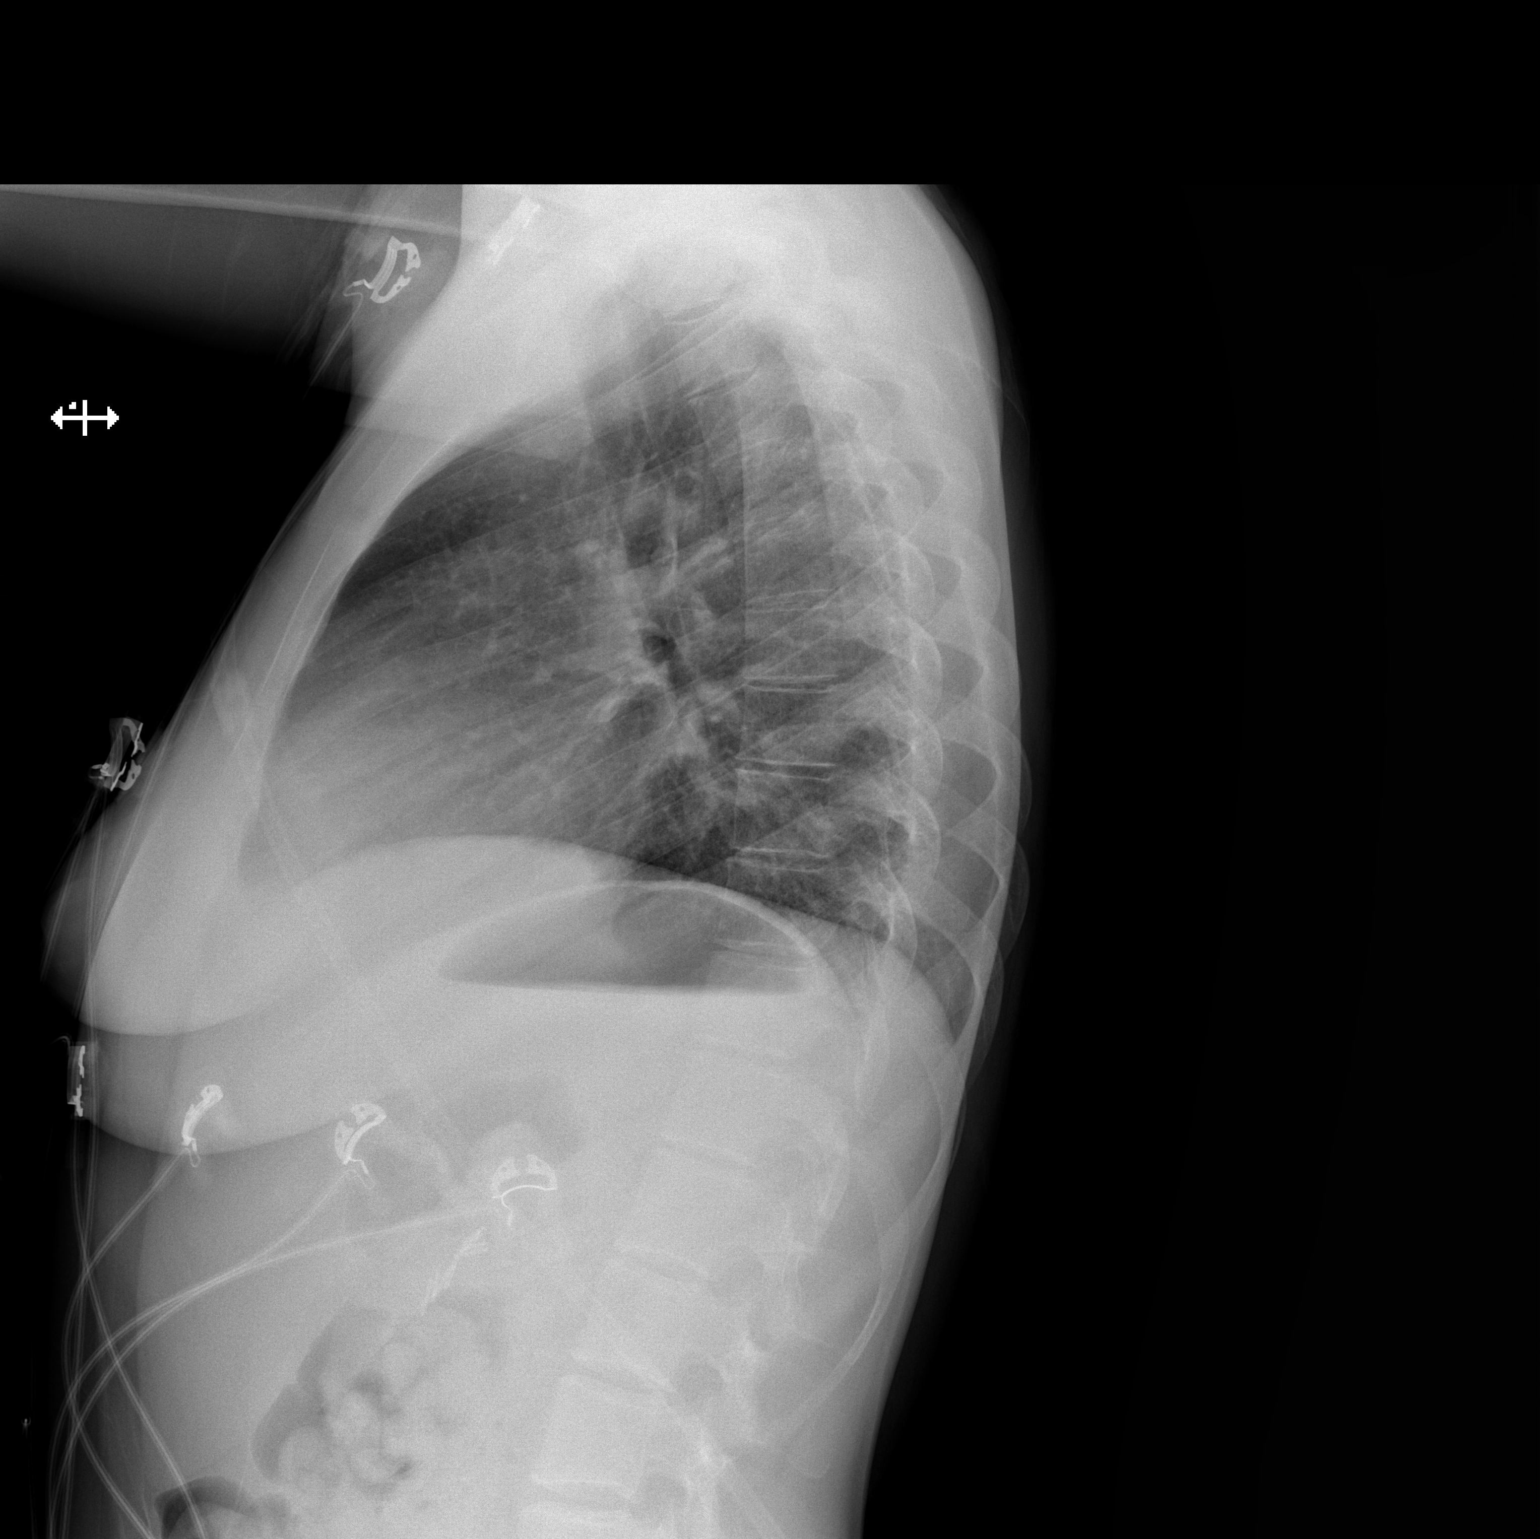

[2 of 2 positions shown; findings below may reference images not displayed]

FINDINGS: Mildly prominent cardiac silhouette. The lungs are clear. No pleural
effusions. Unremarkable hilar and mediastinal contours.
IMPRESSION: Mildly enlarged cardiac silhouette. No acute cardiopulmonary
findings.
